# Patient Record
Sex: Male | Born: 1935 | Race: White | Hispanic: No | Smoking: Former smoker
Health system: Southern US, Community
[De-identification: ages and names within clinical notes are randomized; demographics above are authoritative.]

## PROBLEM LIST (undated history)

## (undated) DIAGNOSIS — K635 Polyp of colon: Secondary | ICD-10-CM

## (undated) DIAGNOSIS — I10 Essential (primary) hypertension: Secondary | ICD-10-CM

## (undated) DIAGNOSIS — E785 Hyperlipidemia, unspecified: Secondary | ICD-10-CM

## (undated) DIAGNOSIS — K449 Diaphragmatic hernia without obstruction or gangrene: Secondary | ICD-10-CM

## (undated) DIAGNOSIS — I2510 Atherosclerotic heart disease of native coronary artery without angina pectoris: Secondary | ICD-10-CM

## (undated) DIAGNOSIS — R011 Cardiac murmur, unspecified: Secondary | ICD-10-CM

## (undated) DIAGNOSIS — G309 Alzheimer's disease, unspecified: Secondary | ICD-10-CM

## (undated) DIAGNOSIS — F0280 Dementia in other diseases classified elsewhere without behavioral disturbance: Secondary | ICD-10-CM

## (undated) DIAGNOSIS — R7301 Impaired fasting glucose: Secondary | ICD-10-CM

## (undated) DIAGNOSIS — N289 Disorder of kidney and ureter, unspecified: Secondary | ICD-10-CM

## (undated) DIAGNOSIS — H905 Unspecified sensorineural hearing loss: Secondary | ICD-10-CM

## (undated) DIAGNOSIS — N259 Disorder resulting from impaired renal tubular function, unspecified: Secondary | ICD-10-CM

## (undated) DIAGNOSIS — K219 Gastro-esophageal reflux disease without esophagitis: Secondary | ICD-10-CM

## (undated) DIAGNOSIS — R972 Elevated prostate specific antigen [PSA]: Secondary | ICD-10-CM

## (undated) HISTORY — DX: Atherosclerotic heart disease of native coronary artery without angina pectoris: I25.10

## (undated) HISTORY — DX: Cardiac murmur, unspecified: R01.1

## (undated) HISTORY — DX: Impaired fasting glucose: R73.01

## (undated) HISTORY — DX: Essential (primary) hypertension: I10

## (undated) HISTORY — DX: Diaphragmatic hernia without obstruction or gangrene: K44.9

## (undated) HISTORY — DX: Hyperlipidemia, unspecified: E78.5

## (undated) HISTORY — DX: Gastro-esophageal reflux disease without esophagitis: K21.9

## (undated) HISTORY — DX: Disorder resulting from impaired renal tubular function, unspecified: N25.9

## (undated) HISTORY — DX: Polyp of colon: K63.5

## (undated) HISTORY — DX: Disorder of kidney and ureter, unspecified: N28.9

## (undated) HISTORY — DX: Elevated prostate specific antigen (PSA): R97.20

## (undated) HISTORY — DX: Unspecified sensorineural hearing loss: H90.5

---

## 1989-05-12 HISTORY — PX: OTHER SURGICAL HISTORY: SHX169

## 1992-05-12 HISTORY — PX: OTHER SURGICAL HISTORY: SHX169

## 1997-08-10 ENCOUNTER — Encounter: Payer: Self-pay | Admitting: Family Medicine

## 1997-08-10 HISTORY — PX: OTHER SURGICAL HISTORY: SHX169

## 1998-02-09 HISTORY — PX: COLONOSCOPY: SHX174

## 1999-01-11 ENCOUNTER — Encounter: Payer: Self-pay | Admitting: Family Medicine

## 2000-01-11 ENCOUNTER — Encounter: Payer: Self-pay | Admitting: Family Medicine

## 2000-01-11 LAB — CONVERTED CEMR LAB: PSA: 1.4 ng/mL

## 2001-01-10 ENCOUNTER — Encounter: Payer: Self-pay | Admitting: Family Medicine

## 2001-01-10 LAB — CONVERTED CEMR LAB: PSA: 1.7 ng/mL

## 2002-01-10 ENCOUNTER — Encounter: Payer: Self-pay | Admitting: Family Medicine

## 2002-08-15 HISTORY — PX: ESOPHAGOGASTRODUODENOSCOPY: SHX1529

## 2003-01-11 ENCOUNTER — Encounter: Payer: Self-pay | Admitting: Family Medicine

## 2003-01-11 LAB — CONVERTED CEMR LAB: PSA: 1.4 ng/mL

## 2003-04-21 ENCOUNTER — Other Ambulatory Visit: Payer: Self-pay

## 2003-05-11 HISTORY — PX: HERNIA REPAIR: SHX51

## 2004-03-12 ENCOUNTER — Encounter: Payer: Self-pay | Admitting: Family Medicine

## 2004-03-12 LAB — CONVERTED CEMR LAB: PSA: 2.07 ng/mL

## 2004-03-21 ENCOUNTER — Ambulatory Visit: Payer: Self-pay | Admitting: Family Medicine

## 2004-03-25 ENCOUNTER — Ambulatory Visit: Payer: Self-pay | Admitting: Family Medicine

## 2004-04-24 ENCOUNTER — Ambulatory Visit: Payer: Self-pay | Admitting: Family Medicine

## 2004-08-16 ENCOUNTER — Ambulatory Visit: Payer: Self-pay | Admitting: Family Medicine

## 2004-09-26 ENCOUNTER — Ambulatory Visit: Payer: Self-pay | Admitting: Family Medicine

## 2004-10-11 ENCOUNTER — Ambulatory Visit: Payer: Self-pay | Admitting: Family Medicine

## 2004-10-30 ENCOUNTER — Ambulatory Visit: Payer: Self-pay | Admitting: Family Medicine

## 2004-11-04 ENCOUNTER — Ambulatory Visit: Payer: Self-pay | Admitting: Family Medicine

## 2005-01-03 ENCOUNTER — Ambulatory Visit: Payer: Self-pay | Admitting: Family Medicine

## 2005-01-20 ENCOUNTER — Ambulatory Visit: Payer: Self-pay | Admitting: Family Medicine

## 2005-01-22 ENCOUNTER — Ambulatory Visit: Payer: Self-pay | Admitting: Family Medicine

## 2005-03-12 ENCOUNTER — Encounter: Payer: Self-pay | Admitting: Family Medicine

## 2005-03-27 ENCOUNTER — Ambulatory Visit: Payer: Self-pay | Admitting: Family Medicine

## 2005-03-31 ENCOUNTER — Ambulatory Visit: Payer: Self-pay | Admitting: Family Medicine

## 2005-04-24 ENCOUNTER — Ambulatory Visit: Payer: Self-pay | Admitting: Family Medicine

## 2005-04-28 ENCOUNTER — Ambulatory Visit: Payer: Self-pay | Admitting: Family Medicine

## 2005-04-30 ENCOUNTER — Ambulatory Visit: Payer: Self-pay | Admitting: Family Medicine

## 2005-06-09 ENCOUNTER — Ambulatory Visit: Payer: Self-pay | Admitting: Family Medicine

## 2005-06-11 ENCOUNTER — Ambulatory Visit: Payer: Self-pay | Admitting: Family Medicine

## 2005-09-09 HISTORY — PX: CYSTOSCOPY: SUR368

## 2005-10-08 ENCOUNTER — Ambulatory Visit: Payer: Self-pay | Admitting: Family Medicine

## 2005-10-10 ENCOUNTER — Ambulatory Visit: Payer: Self-pay | Admitting: Family Medicine

## 2006-03-12 ENCOUNTER — Encounter: Payer: Self-pay | Admitting: Family Medicine

## 2006-03-30 ENCOUNTER — Ambulatory Visit: Payer: Self-pay | Admitting: Family Medicine

## 2006-04-01 ENCOUNTER — Ambulatory Visit: Payer: Self-pay | Admitting: Family Medicine

## 2006-04-24 ENCOUNTER — Ambulatory Visit: Payer: Self-pay | Admitting: Family Medicine

## 2006-09-28 ENCOUNTER — Ambulatory Visit: Payer: Self-pay | Admitting: Family Medicine

## 2006-09-28 LAB — CONVERTED CEMR LAB
BUN: 33 mg/dL — ABNORMAL HIGH (ref 6–23)
Glucose, Bld: 101 mg/dL — ABNORMAL HIGH (ref 70–99)
Microalb Creat Ratio: 244.6 mg/g — ABNORMAL HIGH (ref 0.0–30.0)
PSA: 2.47 ng/mL (ref 0.10–4.00)

## 2006-09-29 ENCOUNTER — Encounter: Payer: Self-pay | Admitting: Family Medicine

## 2006-09-29 DIAGNOSIS — I10 Essential (primary) hypertension: Secondary | ICD-10-CM | POA: Insufficient documentation

## 2006-09-29 DIAGNOSIS — H905 Unspecified sensorineural hearing loss: Secondary | ICD-10-CM

## 2006-09-29 DIAGNOSIS — R7301 Impaired fasting glucose: Secondary | ICD-10-CM

## 2006-09-29 DIAGNOSIS — K449 Diaphragmatic hernia without obstruction or gangrene: Secondary | ICD-10-CM | POA: Insufficient documentation

## 2006-09-29 DIAGNOSIS — E785 Hyperlipidemia, unspecified: Secondary | ICD-10-CM | POA: Insufficient documentation

## 2006-09-29 DIAGNOSIS — R972 Elevated prostate specific antigen [PSA]: Secondary | ICD-10-CM | POA: Insufficient documentation

## 2006-09-29 DIAGNOSIS — K219 Gastro-esophageal reflux disease without esophagitis: Secondary | ICD-10-CM

## 2006-09-29 DIAGNOSIS — N183 Chronic kidney disease, stage 3 (moderate): Secondary | ICD-10-CM

## 2006-09-29 DIAGNOSIS — D126 Benign neoplasm of colon, unspecified: Secondary | ICD-10-CM | POA: Insufficient documentation

## 2006-09-29 DIAGNOSIS — I2510 Atherosclerotic heart disease of native coronary artery without angina pectoris: Secondary | ICD-10-CM | POA: Insufficient documentation

## 2006-10-01 ENCOUNTER — Ambulatory Visit: Payer: Self-pay | Admitting: Family Medicine

## 2007-04-07 ENCOUNTER — Encounter (INDEPENDENT_AMBULATORY_CARE_PROVIDER_SITE_OTHER): Payer: Self-pay | Admitting: *Deleted

## 2007-04-28 ENCOUNTER — Ambulatory Visit: Payer: Self-pay | Admitting: Unknown Physician Specialty

## 2007-06-21 ENCOUNTER — Encounter: Payer: Self-pay | Admitting: Family Medicine

## 2007-09-27 ENCOUNTER — Encounter: Payer: Self-pay | Admitting: Family Medicine

## 2007-11-04 ENCOUNTER — Ambulatory Visit: Payer: Self-pay | Admitting: Cardiology

## 2008-03-30 ENCOUNTER — Encounter: Payer: Self-pay | Admitting: Family Medicine

## 2008-04-18 ENCOUNTER — Ambulatory Visit: Payer: Self-pay | Admitting: Internal Medicine

## 2008-08-03 ENCOUNTER — Ambulatory Visit: Payer: Self-pay | Admitting: Unknown Physician Specialty

## 2008-09-25 ENCOUNTER — Encounter: Payer: Self-pay | Admitting: Family Medicine

## 2008-10-19 DIAGNOSIS — R011 Cardiac murmur, unspecified: Secondary | ICD-10-CM | POA: Insufficient documentation

## 2009-02-12 ENCOUNTER — Encounter: Payer: Self-pay | Admitting: Family Medicine

## 2009-08-13 ENCOUNTER — Encounter: Payer: Self-pay | Admitting: Family Medicine

## 2009-12-12 ENCOUNTER — Encounter (INDEPENDENT_AMBULATORY_CARE_PROVIDER_SITE_OTHER): Payer: Self-pay | Admitting: *Deleted

## 2010-02-11 ENCOUNTER — Encounter: Payer: Self-pay | Admitting: Family Medicine

## 2010-06-12 NOTE — Letter (Signed)
Summary: Dr. [facility],Note  Dr. Anne Shutter [facility],Note   Imported By: Beau Fanny 08/17/2009 13:30:52  _____________________________________________________________________  External Attachment:    Type:   Image     Comment:   External Document

## 2010-06-12 NOTE — Letter (Signed)
Summary: Derek [name] letter  [place] [facility]  [address] [place], [place] [address]   Phone: [phone]  Fax: [phone]       12/12/2009 MRN: [id number]  Derek [name] [address] [place], [place]  [address]  Dear Mr. Derek [name] and [facility] announce the retirement of Derek [name], M.D., from full-time practice at the [facility] office effective November 08, 2009 and his plans of returning part-time.  It is important to Dr. Hetty [name] and to our practice that you understand that [facility] has seven physicians in our office for your health care needs.  We will continue to offer the same exceptional care that you have today.    Dr. Hetty [name] has spoken to many of you about his plans for retirement and returning part-time in the fall.   We will continue to work with you through the transition to schedule appointments for you in the office and meet the high standards that [facility] is committed to.   Again, it is with great pleasure that we share the news that Dr. Hetty [name] will return to [facility] in October of 2011 with a reduced schedule.    If you have any questions, or would like to request an appointment with one of our physicians, please call us at [phone] and press the option for Scheduling an appointment.  We take pleasure in providing you with excellent patient care and look forward to seeing you at your next office visit.  Our [facility] Physicians are:  Tillman Abide, M.D. Laurita Quint, M.D. Roxy Manns, M.D. Kerby Nora, M.D. Hannah Beat, M.D. Ruthe Mannan, M.D. We proudly welcomed Raechel Ache, M.D. and Eustaquio Boyden, M.D. to the practice in July/August 2011.  Sincerely,  [facility]

## 2010-06-12 NOTE — Letter (Signed)
Summary: Renal Return Patient Eval/[facility]  Renal Return Patient Eval/[facility]   Imported By: Sherian Rein 02/21/2010 09:30:59  _____________________________________________________________________  External Attachment:    Type:   Image     Comment:   External Document

## 2010-07-17 ENCOUNTER — Ambulatory Visit: Payer: Self-pay | Admitting: Internal Medicine

## 2010-08-19 ENCOUNTER — Inpatient Hospital Stay: Payer: Self-pay | Admitting: Internal Medicine

## 2010-09-24 NOTE — Assessment & Plan Note (Signed)
[facility]                            [place] OFFICE NOTE   NAME:[name] [name] TAY                        MRN:          [id number]  DATE:11/04/2007                            DOB:          1930/12/26    PRIMARY CARE PHYSICIAN:  Derek Rosenthal, MD   REASON FOR CONSULTATION:  Evaluate the patient with heart murmur and  coronary artery disease.   HISTORY OF PRESENT ILLNESS:  The patient is now a pleasant 77 year old  gentleman.  I saw him some 10 years ago, when I first started with  [facility].  He had seen our group prior to that for chest discomfort and  questionable MI.  There was never a documented MI.  In fact, I was able  to go back to 1994 and find a catheterization that demonstrated he had  nonobstructive disease with an LAD 30% stenosis and a circumflex 50%  stenosis.   He since has done well.  He lost weight per my suggestions.  He  exercises daily.  With his level of activity, which is walking, he gets  heart rate up, and does not have any chest pressure, neck discomfort,  arm discomfort, activity-induced nausea, vomiting, or excessive  diaphoresis.  He has no palpitation, presyncope, or syncope.  He thinks  his breathing is fine and does not have any resting shortness of breath  with no PND or orthopnea.  He did have one episode of chest discomfort  once while walking in the snow some months ago.   He was noted recently to have a heart murmur and came for further  evaluation of this.  I got the echocardiogram from Dr. Theora [name] office,  and this demonstrates normal left ventricular function with mild  calcific aortic stenosis and mild mitral regurgitation.  Otherwise, it  was unremarkable.   PAST MEDICAL HISTORY:  Hypertension, hyperlipidemia, diverticulosis,  nonobstructive coronary artery disease, gastroesophageal reflux, renal  insufficiency (felt to be secondary to hypertension and nonsteroidals  followed at [name]), and benign prostatic  hypertrophy.   PAST SURGICAL HISTORY:  Right and left inguinal hernia repair.   ALLERGIES/INTOLERANCES:  None.   MEDICATIONS:  1. Aspirin 81 mg daily.  2. Avodart 0.5 mg daily.  3. Lasix 40 mg daily.  4. Omeprazole 20 mg daily.  5. Metoprolol 12.5 mg daily.  6. Cozaar 12.5 mg daily.  7. Lipitor 80 mg daily.   SOCIAL HISTORY:  He has been married for 56 years.  He has two children,  five grandchildren, and five great-grandchildren.  He quit smoking in  1982.  He does not drink alcohol.   FAMILY HISTORY:  Noncontributory for early coronary artery disease.  His  mother had 2 pacemakers.   REVIEW OF SYSTEMS:  As stated in the HPI.  Positive for lightheadedness,  which he thinks is related to some low blood pressures, (he brings a  diary and it demonstrates blood pressures systolic typically in the 110s  and diastolics in the 60s).   PHYSICAL EXAMINATION:  GENERAL:  The patient is in no distress.  VITAL SIGNS:  Blood pressure 132/72,  heart rate 70 and regular, and  weight 147 pounds.  HEENT:  Eyes are remarkable.  Pupils are equal, round, and reactive to  light.  Fundi within normal limits.  Oral mucosa unremarkable.  NECK:  No jugular venous distention at 45 degrees.  Carotid upstroke  brisk and symmetrical.  No bruits.  No thyromegaly.  LYMPHATICS:  No cervical, axillary, or inguinal adenopathy.  LUNGS:  Clear to auscultation bilaterally.  BACK:  No costovertebral angle tenderness.  CHEST:  Unremarkable.  HEART:  PMI not displaced or sustained.  S1 and S2 within normal limits.  No S3 and no S4.  A 2/6 apical brief early peaking systolic murmur  slightly radiating at the aortic outflow tract.  No diastolic murmurs.  ABDOMEN:  Flat.  Positive bowel sounds.  Normal in frequency and pitch.  No bruits, rebound, guarding, or midline pulsatile mass.  No  hepatomegaly or splenomegaly.  SKIN:  No rashes, no nodules.  EXTREMITIES:  2+ pulses throughout.  No edema, no cyanosis, or  clubbing.  NEURO:  Alert and oriented to person, place, and time.  Cranial nerves  II-XII grossly intact.  Motor grossly intact.   DIAGNOSTIC IMAGING:  EKG; ectopic atrial rhythm, axis within normal  limits, intervals within normal limits, and no acute ST-wave changes.   ASSESSMENT/PLAN:  1. Heart murmur.  The patient does have heart murmur related to his      aortic sclerosis.  This is not severe and not causing any symptoms      and it can be followed clinically.  I would only suggest followup      echocardiography if the murmur changes considerably in intensity      over time.  2. Coronary artery disease.  The patient had nonobstructive coronary      disease 15 years ago.  He is active and has no symptoms.  He should      participate aggressively in risk reduction.  No further      cardiovascular testing is suggested.  I would have a low threshold      for testing; however, if he has any symptom such as dyspnea or any      decreased exercise tolerance or chest discomfort in the future.  3. Abnormal EKG.  The patient has an ectopic atrial rhythm as      indicated by his abnormal P-wave axis.  This is not symptomatic and      requires no further therapy.  4. Hypotension.  His blood pressure is low, and he is symptomatic with      this.  However, he tells me Dr. Cherly Hensen at [facility] is following this,      and that he is slowly having his meds titrated down.  I will defer      to Dr. Cherly Hensen and Dr. Manson Passey.  5. Renal insufficiency per his primary team.  6. Followup will be back in this clinic as needed.     [name] Rotunda, MD, [facility]  Electronically Signed    JH/MedQ  DD: 11/04/2007  DT: 11/05/2007  Job #: [id number]   cc:   [name] [name], M.D.

## 2010-09-27 NOTE — Letter (Signed)
April 06, 2006    Dr. Olegario Messier  Urology Dept.,  [facility]   RE:  [name], [name]  MRN:  [id number]  /  DOB:  1930/05/19   Dear Dr. Elease Hashimoto,   This is in regard to a patient we share by the name of Derek [name]. This  is a 77 year old white male whom you are following for renal insufficiency.  The patient has recently had a PSA elevation greater than 10% and I wanted  to make you aware of that. His PSAs have been 1.0 in 1998, 1.5 in 1999, 1.4  in 2000, 1.7 in 2001, 1.9 in 2002, 1.4 in 2003, 2.07 in 2004, 1.48 in 2005  and 3.06 this month. His prostate on exam by me today is at most 20 grams  and feels soft, symmetric with raphe intact and no nodularity. He is without  any symptoms whatsoever and urination has been okay with him.   PAST MEDICAL HISTORY:  Significant for hypertension since 1994, coronary  disease  since 1994, elevated cholesterol since 1995, polyps of the colon  found in 1991 on colonoscopy, most recently seen in April of 2004 with  diverticula and biopsy negative polyps at that time. He also has  sensorineural hearing loss since 1999 with hearing aids, renal insufficiency  as above since September 2004, creatinine clearance in the 47 mg per  deciliter range and elevated glucose since May of last year well-controlled  with a hemoglobin A1c of less than 6 and a micro albumin mildly elevated at  154 mg per deciliter.   MEDICATIONS:  1. Cozaar 25 mg b.i.d.  2. Furosemide 20 mg a day.  3. Toprol XL 50 mg twice a day.  4. Simvastatin 80 mg at night.  5. Prilosec 20 mg a day.  6. AcipHex 20 mg a day when he can get it.  7. Enteric coated 81 mg aspirin a day.  8. He also uses Tylenol fairly routinely.   I appreciate your seeing him and I hope this information helps.    Sincerely,      Arta Silence, MD  Electronically Signed    RNS/MedQ  DD: 04/06/2006  DT: 04/06/2006  Job #: [id number]

## 2010-10-28 ENCOUNTER — Encounter: Payer: Self-pay | Admitting: Cardiovascular Disease

## 2011-02-06 ENCOUNTER — Ambulatory Visit: Payer: Self-pay | Admitting: Internal Medicine

## 2011-02-18 ENCOUNTER — Emergency Department: Payer: Self-pay | Admitting: Emergency Medicine

## 2011-10-31 ENCOUNTER — Encounter: Payer: Self-pay | Admitting: Family Medicine

## 2011-12-04 ENCOUNTER — Emergency Department: Payer: Self-pay | Admitting: Emergency Medicine

## 2012-04-26 ENCOUNTER — Encounter: Payer: Self-pay | Admitting: Cardiovascular Disease

## 2012-05-12 ENCOUNTER — Encounter: Payer: Self-pay | Admitting: Cardiovascular Disease

## 2012-05-25 ENCOUNTER — Ambulatory Visit: Payer: Self-pay | Admitting: Internal Medicine

## 2012-05-25 LAB — CBC CANCER CENTER
Basophil: 2 %
Comment - H1-Com1: NORMAL
Eosinophil #: 0 x10 3/mm (ref 0.0–0.7)
Eosinophil %: 0.1 %
Eosinophil: 1 %
HCT: 29.9 % — ABNORMAL LOW (ref 40.0–52.0)
Lymphocyte #: 0.6 x10 3/mm — ABNORMAL LOW (ref 1.0–3.6)
Lymphocyte %: 6.1 %
Lymphocytes: 3 %
MCHC: 32 g/dL (ref 32.0–36.0)
Neutrophil #: 8.9 x10 3/mm — ABNORMAL HIGH (ref 1.4–6.5)
Platelet: 246 x10 3/mm (ref 150–440)
RBC: 3.13 10*6/uL — ABNORMAL LOW (ref 4.40–5.90)
WBC: 10.3 x10 3/mm (ref 3.8–10.6)

## 2012-05-25 LAB — IRON AND TIBC
Iron Bind.Cap.(Total): 271 ug/dL (ref 250–450)
Iron Saturation: 9 %
Unbound Iron-Bind.Cap.: 247 ug/dL

## 2012-05-25 LAB — RETICULOCYTES: Reticulocyte: 1.05 % (ref 0.7–2.5)

## 2012-05-25 LAB — FOLATE: Folic Acid: 18.5 ng/mL (ref 3.1–100.0)

## 2012-06-12 ENCOUNTER — Ambulatory Visit: Payer: Self-pay | Admitting: Internal Medicine

## 2012-06-12 ENCOUNTER — Encounter: Payer: Self-pay | Admitting: Cardiovascular Disease

## 2012-06-19 ENCOUNTER — Inpatient Hospital Stay: Payer: Self-pay | Admitting: Internal Medicine

## 2012-06-19 LAB — CBC
HCT: 17.2 % — ABNORMAL LOW (ref 40.0–52.0)
HGB: 5.6 g/dL — ABNORMAL LOW (ref 13.0–18.0)
MCH: 31.1 pg (ref 26.0–34.0)
MCHC: 32.9 g/dL (ref 32.0–36.0)
MCV: 95 fL (ref 80–100)
Platelet: 163 x10 3/mm 3 (ref 150–440)
RBC: 1.81 x10 6/mm 3 — ABNORMAL LOW (ref 4.40–5.90)
RDW: 13.1 % (ref 11.5–14.5)
WBC: 2.9 x10 3/mm 3 — ABNORMAL LOW (ref 3.8–10.6)

## 2012-06-19 LAB — URINALYSIS, COMPLETE
Bilirubin,UR: NEGATIVE
Glucose,UR: NEGATIVE mg/dL (ref 0–75)
Ketone: NEGATIVE
Leukocyte Esterase: NEGATIVE
Nitrite: NEGATIVE
Protein: 30
Specific Gravity: 1.013 (ref 1.003–1.030)
Squamous Epithelial: NONE SEEN
WBC UR: 1 /HPF (ref 0–5)

## 2012-06-19 LAB — COMPREHENSIVE METABOLIC PANEL
Alkaline Phosphatase: 64 U/L (ref 50–136)
Anion Gap: 11 (ref 7–16)
Bilirubin,Total: 0.2 mg/dL (ref 0.2–1.0)
Chloride: 113 mmol/L — ABNORMAL HIGH (ref 98–107)
Co2: 19 mmol/L — ABNORMAL LOW (ref 21–32)
EGFR (African American): 25 — ABNORMAL LOW
Potassium: 4.4 mmol/L (ref 3.5–5.1)
SGOT(AST): 29 U/L (ref 15–37)
SGPT (ALT): 24 U/L (ref 12–78)
Sodium: 143 mmol/L (ref 136–145)
Total Protein: 5.8 g/dL — ABNORMAL LOW (ref 6.4–8.2)

## 2012-06-19 LAB — TROPONIN I
Troponin-I: <0.02 ng/mL
Troponin-I: <0.02 ng/mL

## 2012-06-20 LAB — COMPREHENSIVE METABOLIC PANEL
Albumin: 2.7 g/dL — ABNORMAL LOW (ref 3.4–5.0)
Anion Gap: 7 (ref 7–16)
Bilirubin,Total: 0.2 mg/dL (ref 0.2–1.0)
Calcium, Total: 7.6 mg/dL — ABNORMAL LOW (ref 8.5–10.1)
Chloride: 117 mmol/L — ABNORMAL HIGH (ref 98–107)
EGFR (Non-African Amer.): 29 — ABNORMAL LOW
SGOT(AST): 23 U/L (ref 15–37)
Sodium: 143 mmol/L (ref 136–145)

## 2012-06-20 LAB — CBC WITH DIFFERENTIAL/PLATELET
Basophil #: 0 10*3/uL (ref 0.0–0.1)
Basophil %: 0.8 %
Eosinophil #: 0 10*3/uL (ref 0.0–0.7)
Eosinophil %: 1.1 %
HCT: 20 % — ABNORMAL LOW (ref 40.0–52.0)
HGB: 6.6 g/dL — ABNORMAL LOW (ref 13.0–18.0)
Lymphocyte #: 0.8 10*3/uL — ABNORMAL LOW (ref 1.0–3.6)
Lymphocyte %: 28.7 %
MCH: 30.4 pg (ref 26.0–34.0)
MCHC: 33.3 g/dL (ref 32.0–36.0)
MCV: 91 fL (ref 80–100)
Monocyte #: 0.4 x10 3/mm (ref 0.2–1.0)
Monocyte %: 14.3 %
Neutrophil #: 1.6 10*3/uL (ref 1.4–6.5)
Neutrophil %: 55.1 %
Platelet: 165 10*3/uL (ref 150–440)
RBC: 2.19 10*6/uL — ABNORMAL LOW (ref 4.40–5.90)
RDW: 16.3 % — ABNORMAL HIGH (ref 11.5–14.5)
WBC: 2.9 10*3/uL — ABNORMAL LOW (ref 3.8–10.6)

## 2012-06-20 LAB — HEMOGLOBIN: HGB: 7.9 g/dL — ABNORMAL LOW (ref 13.0–18.0)

## 2012-06-20 LAB — URINALYSIS, COMPLETE
Blood: NEGATIVE
Ketone: NEGATIVE
Leukocyte Esterase: NEGATIVE
Protein: NEGATIVE
RBC,UR: <1 /HPF (ref 0–5)
Specific Gravity: 1.005 (ref 1.003–1.030)

## 2012-06-20 LAB — MAGNESIUM: Magnesium: 1.8 mg/dL

## 2012-06-21 LAB — CBC WITH DIFFERENTIAL/PLATELET
Basophil #: 0 10*3/uL (ref 0.0–0.1)
Basophil %: 0.8 %
Eosinophil #: 0.2 10*3/uL (ref 0.0–0.7)
Eosinophil %: 3.7 %
HCT: 24 % — ABNORMAL LOW (ref 40.0–52.0)
HGB: 7.9 g/dL — ABNORMAL LOW (ref 13.0–18.0)
Lymphocyte #: 1 10*3/uL (ref 1.0–3.6)
Lymphocyte %: 23.2 %
MCH: 29.2 pg (ref 26.0–34.0)
MCV: 88 fL (ref 80–100)
Monocyte #: 0.5 x10 3/mm (ref 0.2–1.0)
Monocyte %: 13 %
Neutrophil #: 2.5 10*3/uL (ref 1.4–6.5)
Neutrophil %: 59.3 %
RBC: 2.72 10*6/uL — ABNORMAL LOW (ref 4.40–5.90)
RDW: 18.1 % — ABNORMAL HIGH (ref 11.5–14.5)

## 2012-06-21 LAB — BASIC METABOLIC PANEL
Calcium, Total: 8.1 mg/dL — ABNORMAL LOW (ref 8.5–10.1)
Chloride: 116 mmol/L — ABNORMAL HIGH (ref 98–107)
Co2: 22 mmol/L (ref 21–32)
Creatinine: 1.88 mg/dL — ABNORMAL HIGH (ref 0.60–1.30)
EGFR (African American): 39 — ABNORMAL LOW
EGFR (Non-African Amer.): 33 — ABNORMAL LOW
Glucose: 86 mg/dL (ref 65–99)
Osmolality: 292 (ref 275–301)
Potassium: 5.1 mmol/L (ref 3.5–5.1)

## 2012-06-21 LAB — PROTIME-INR: INR: 1.2

## 2012-06-21 LAB — RETICULOCYTES: Reticulocyte: 3.07 % — ABNORMAL HIGH (ref 0.7–2.5)

## 2012-06-21 LAB — OCCULT BLOOD X 1 CARD TO LAB, STOOL: Occult Blood, Feces: POSITIVE

## 2012-06-21 LAB — IRON AND TIBC
Iron Bind.Cap.(Total): 215 ug/dL — ABNORMAL LOW (ref 250–450)
Iron Saturation: 13 %
Iron: 29 ug/dL — ABNORMAL LOW (ref 65–175)

## 2012-06-22 LAB — CBC WITH DIFFERENTIAL/PLATELET
Basophil %: 0.8 %
Eosinophil #: 0.2 10*3/uL (ref 0.0–0.7)
Eosinophil %: 4.6 %
HCT: 25.1 % — ABNORMAL LOW (ref 40.0–52.0)
Lymphocyte #: 1.1 10*3/uL (ref 1.0–3.6)
MCHC: 33.3 g/dL (ref 32.0–36.0)
MCV: 88 fL (ref 80–100)
Monocyte #: 0.6 x10 3/mm (ref 0.2–1.0)
Monocyte %: 12.9 %
Neutrophil #: 2.6 10*3/uL (ref 1.4–6.5)
Neutrophil %: 57.9 %
Platelet: 214 10*3/uL (ref 150–440)

## 2012-06-22 LAB — PROTIME-INR
INR: 1.1
Prothrombin Time: 14.6 secs (ref 11.5–14.7)

## 2012-06-23 LAB — CBC WITH DIFFERENTIAL/PLATELET
Basophil %: 1.2 %
Eosinophil %: 2.2 %
HCT: 26 % — ABNORMAL LOW (ref 40.0–52.0)
HGB: 8.7 g/dL — ABNORMAL LOW (ref 13.0–18.0)
Lymphocyte #: 0.9 10*3/uL — ABNORMAL LOW (ref 1.0–3.6)
Lymphocyte %: 19.4 %
MCHC: 33.5 g/dL (ref 32.0–36.0)
MCV: 89 fL (ref 80–100)
Monocyte #: 0.5 x10 3/mm (ref 0.2–1.0)
Neutrophil %: 66 %
Platelet: 217 10*3/uL (ref 150–440)
WBC: 4.8 10*3/uL (ref 3.8–10.6)

## 2012-06-23 LAB — PROTIME-INR
INR: 1.1
Prothrombin Time: 14.3 secs (ref 11.5–14.7)

## 2012-06-24 LAB — CBC WITH DIFFERENTIAL/PLATELET
Basophil #: 0.1 x10 3/mm 3 (ref 0.0–0.1)
Basophil %: 1.3 %
Eosinophil #: 0.1 x10 3/mm 3 (ref 0.0–0.7)
Eosinophil %: 3.3 %
HCT: 25.2 % — ABNORMAL LOW (ref 40.0–52.0)
HGB: 8.4 g/dL — ABNORMAL LOW (ref 13.0–18.0)
Lymphocyte %: 20 %
Lymphs Abs: 0.9 x10 3/mm 3 — ABNORMAL LOW (ref 1.0–3.6)
MCH: 29.5 pg (ref 26.0–34.0)
MCHC: 33.2 g/dL (ref 32.0–36.0)
MCV: 89 fL (ref 80–100)
Monocyte #: 0.5 x10 3/mm (ref 0.2–1.0)
Monocyte %: 11 %
Neutrophil #: 2.9 x10 3/mm 3 (ref 1.4–6.5)
Neutrophil %: 64.4 %
Platelet: 216 x10 3/mm 3 (ref 150–440)
RBC: 2.84 x10 6/mm 3 — ABNORMAL LOW (ref 4.40–5.90)
RDW: 17.5 % — ABNORMAL HIGH (ref 11.5–14.5)
WBC: 4.5 x10 3/mm 3 (ref 3.8–10.6)

## 2012-06-25 LAB — BASIC METABOLIC PANEL
Anion Gap: 8 (ref 7–16)
BUN: 33 mg/dL — ABNORMAL HIGH (ref 7–18)
Chloride: 113 mmol/L — ABNORMAL HIGH (ref 98–107)
EGFR (African American): 30 — ABNORMAL LOW
EGFR (Non-African Amer.): 26 — ABNORMAL LOW
Glucose: 89 mg/dL (ref 65–99)
Osmolality: 290 (ref 275–301)
Potassium: 4.7 mmol/L (ref 3.5–5.1)

## 2012-06-25 LAB — CBC WITH DIFFERENTIAL/PLATELET
Basophil #: 0.1 10*3/uL (ref 0.0–0.1)
Basophil %: 1.1 %
Eosinophil #: 0.2 10*3/uL (ref 0.0–0.7)
HCT: 25.2 % — ABNORMAL LOW (ref 40.0–52.0)
Lymphocyte %: 18.2 %
MCH: 29.4 pg (ref 26.0–34.0)
MCHC: 33 g/dL (ref 32.0–36.0)
Monocyte #: 0.6 x10 3/mm (ref 0.2–1.0)
Monocyte %: 12.1 %
Platelet: 215 10*3/uL (ref 150–440)
WBC: 4.7 10*3/uL (ref 3.8–10.6)

## 2012-06-29 ENCOUNTER — Ambulatory Visit: Payer: Self-pay | Admitting: Internal Medicine

## 2012-06-29 LAB — CBC CANCER CENTER
Basophil #: 0.1 x10 3/mm (ref 0.0–0.1)
Basophil %: 1 %
Eosinophil %: 1 %
HCT: 26.9 % — ABNORMAL LOW (ref 40.0–52.0)
HGB: 8.8 g/dL — ABNORMAL LOW (ref 13.0–18.0)
Lymphocyte %: 15 %
MCHC: 32.7 g/dL (ref 32.0–36.0)
MCV: 90 fL (ref 80–100)
Monocyte #: 0.7 x10 3/mm (ref 0.2–1.0)
Monocyte %: 10.4 %
Neutrophil #: 4.7 x10 3/mm (ref 1.4–6.5)
Neutrophil %: 72.6 %
Platelet: 234 x10 3/mm (ref 150–440)

## 2012-07-10 ENCOUNTER — Ambulatory Visit: Payer: Self-pay | Admitting: Internal Medicine

## 2012-07-21 ENCOUNTER — Encounter: Payer: Self-pay | Admitting: Cardiovascular Disease

## 2012-07-27 ENCOUNTER — Ambulatory Visit: Payer: Self-pay | Admitting: Internal Medicine

## 2012-07-27 LAB — CANCER CENTER HEMOGLOBIN: HGB: 9.6 g/dL — ABNORMAL LOW (ref 13.0–18.0)

## 2012-08-10 ENCOUNTER — Encounter: Payer: Self-pay | Admitting: Cardiovascular Disease

## 2012-08-10 ENCOUNTER — Ambulatory Visit: Payer: Self-pay | Admitting: Internal Medicine

## 2012-08-31 LAB — IRON AND TIBC
Iron Saturation: 25 %
Iron: 66 ug/dL (ref 65–175)
Unbound Iron-Bind.Cap.: 196 ug/dL

## 2012-08-31 LAB — FERRITIN: Ferritin (ARMC): 566 ng/mL — ABNORMAL HIGH (ref 8–388)

## 2012-08-31 LAB — RETICULOCYTES
Absolute Retic Count: 0.0323 10*6/uL
Reticulocyte: 0.93 %

## 2012-09-09 ENCOUNTER — Ambulatory Visit: Payer: Self-pay | Admitting: Internal Medicine

## 2012-09-20 LAB — CBC CANCER CENTER
Basophil #: 0.1 x10 3/mm (ref 0.0–0.1)
Basophil %: 1.7 %
Eosinophil #: 0.1 x10 3/mm (ref 0.0–0.7)
HGB: 10.7 g/dL — ABNORMAL LOW (ref 13.0–18.0)
Lymphocyte #: 0.7 x10 3/mm — ABNORMAL LOW (ref 1.0–3.6)
MCHC: 33.2 g/dL (ref 32.0–36.0)
MCV: 92 fL (ref 80–100)
Monocyte #: 0.7 x10 3/mm (ref 0.2–1.0)
Monocyte %: 12.4 %
Neutrophil %: 71.8 %
Platelet: 198 x10 3/mm (ref 150–440)
RBC: 3.52 10*6/uL — ABNORMAL LOW (ref 4.40–5.90)
RDW: 14.6 % — ABNORMAL HIGH (ref 11.5–14.5)
WBC: 5.4 x10 3/mm (ref 3.8–10.6)

## 2012-10-10 ENCOUNTER — Ambulatory Visit: Payer: Self-pay | Admitting: Internal Medicine

## 2013-01-21 ENCOUNTER — Ambulatory Visit: Payer: Self-pay | Admitting: Internal Medicine

## 2013-02-06 IMAGING — CT CT HEAD WITHOUT CONTRAST
1 series · 16 of 30 positions shown, 20 images · non-contrast
Comparison: none

REASON FOR EXAM: CVA
COMMENTS:

PROCEDURE:     KCT - KCT HEAD WITHOUT CONTRAST  - February 06, 2011  [DATE]
RESULT:     Comparison:  07/17/2010
TECHNIQUE: Multiple axial images from the foramen magnum to the vertex were
obtained without IV contrast.

[Series 2: 1 soft tissue · axial · 0.39mm/px · z∈[-107,+28]mm · 16 of 30 slices shown, 20 images]
[im 2/30  brain]
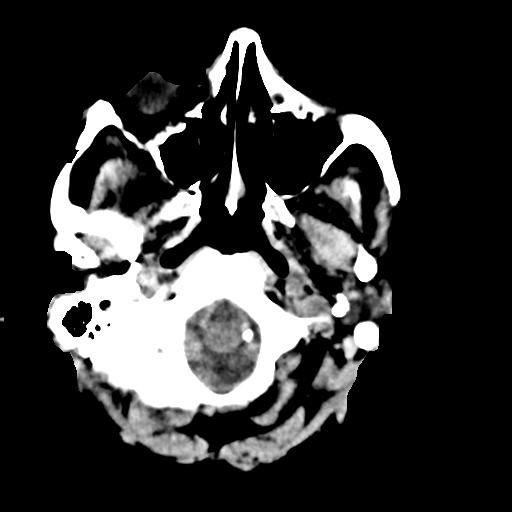
[im 2/30  bone]
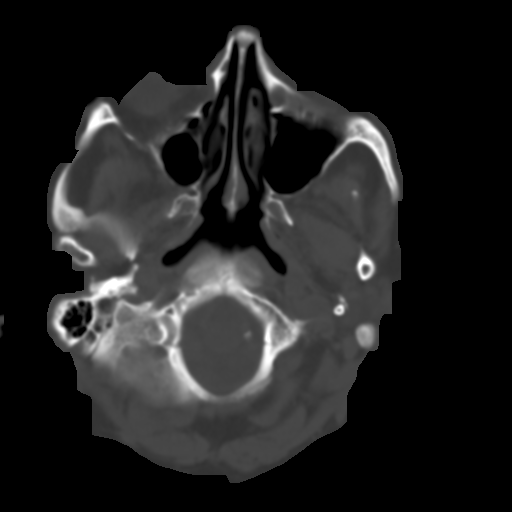
[im 4/30  brain]
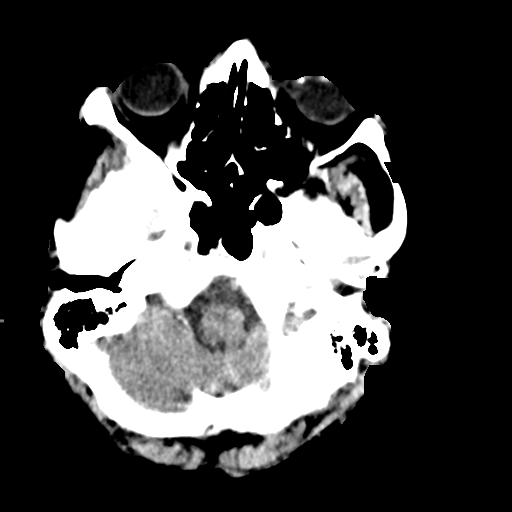
[im 6/30  brain]
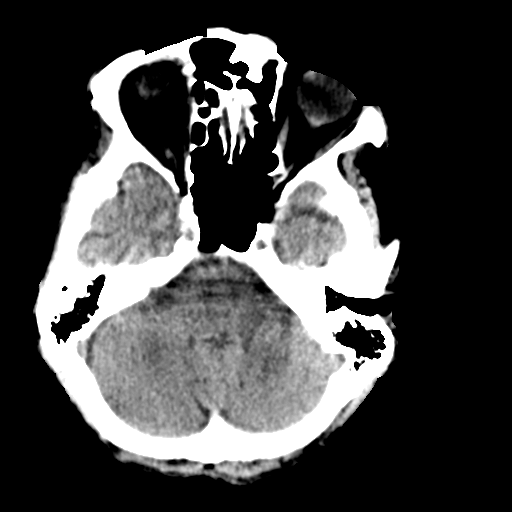
[im 8/30  brain]
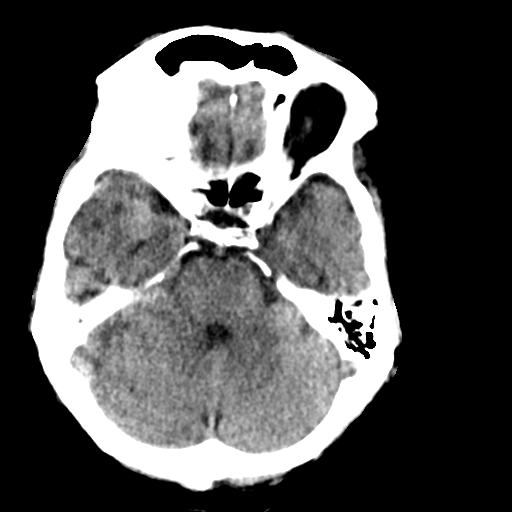
[im 9/30  brain]
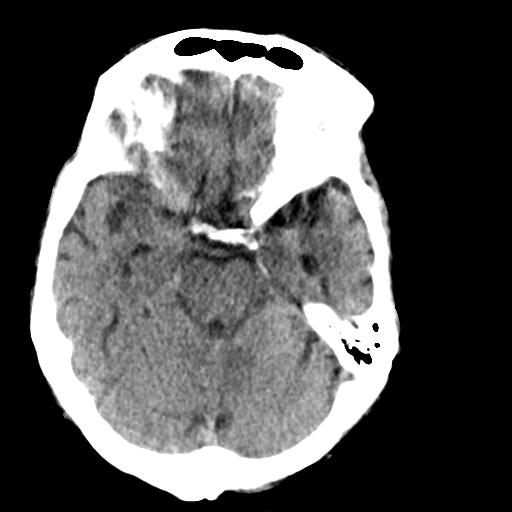
[im 9/30  bone]
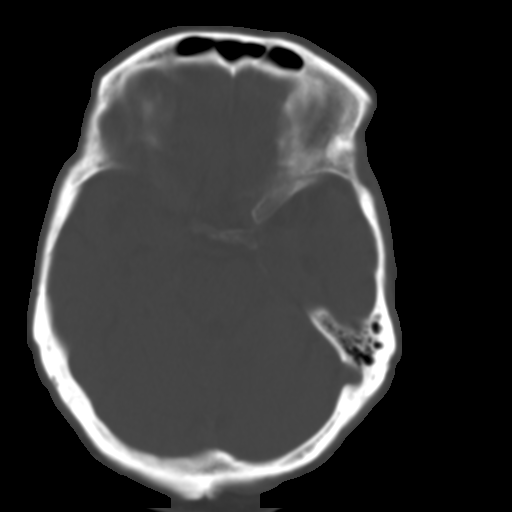
[im 11/30  brain]
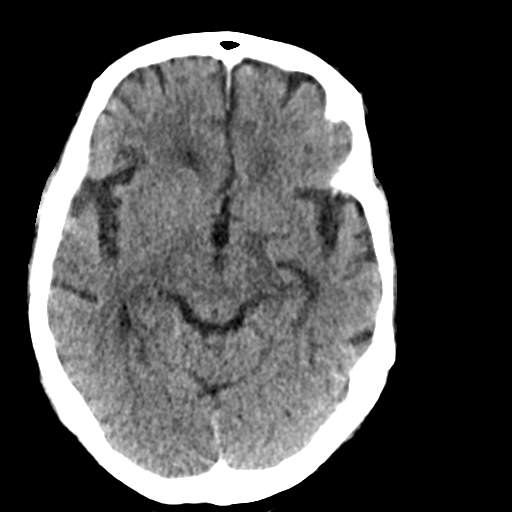
[im 13/30  brain]
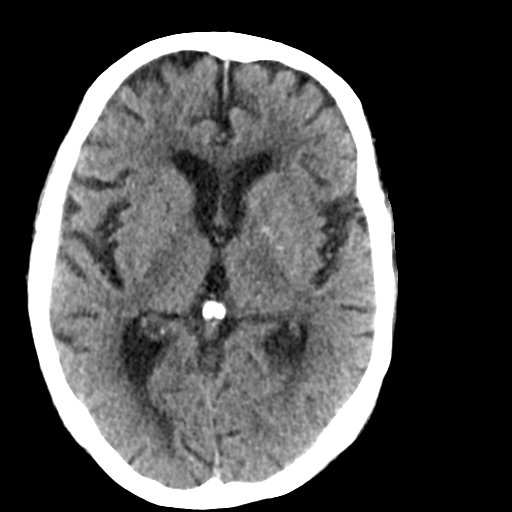
[im 15/30  brain]
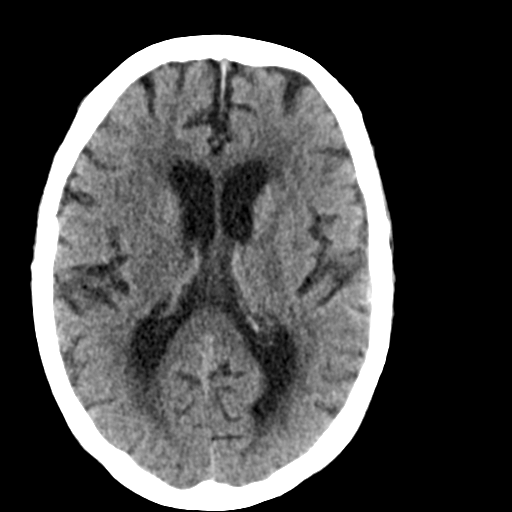
[im 16/30  brain]
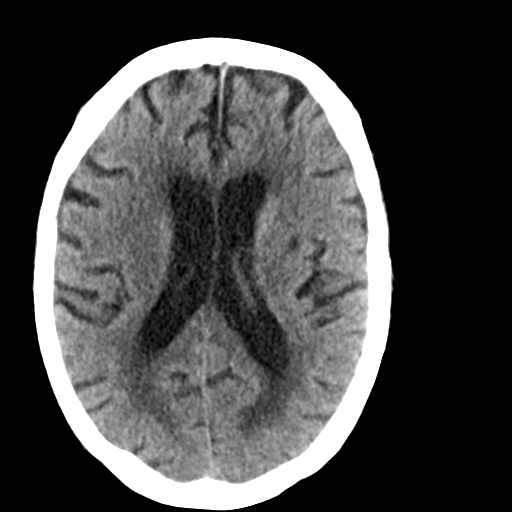
[im 16/30  bone]
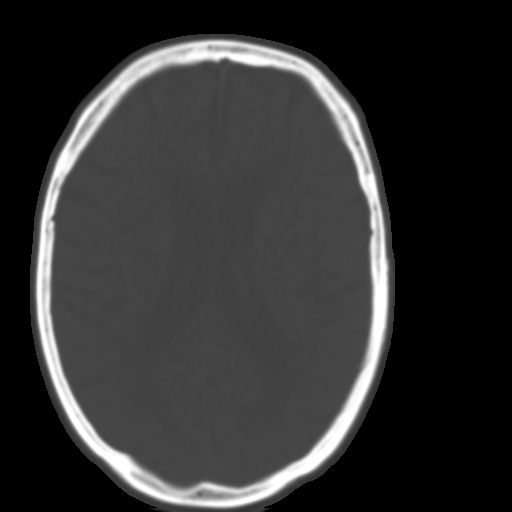
[im 18/30  brain]
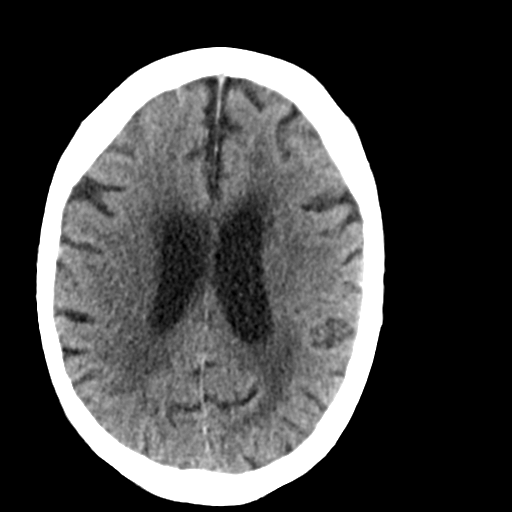
[im 20/30  brain]
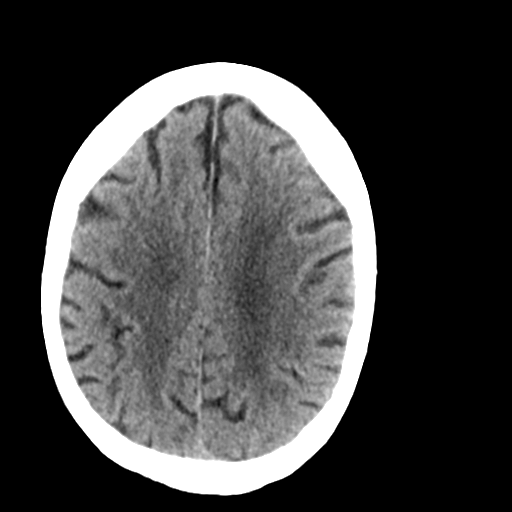
[im 22/30  brain]
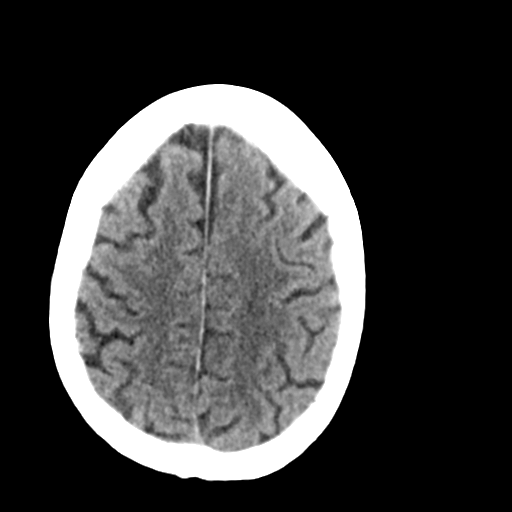
[im 23/30  brain]
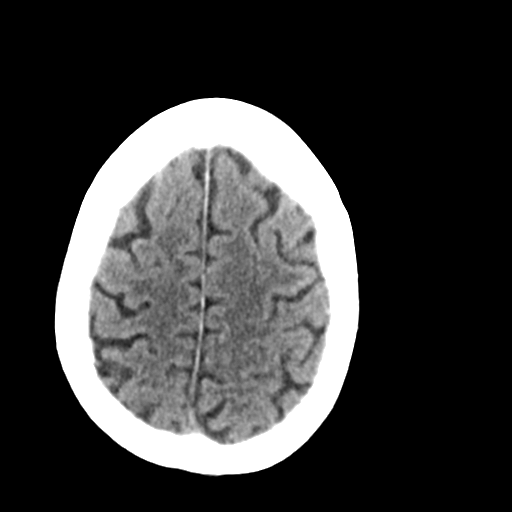
[im 23/30  bone]
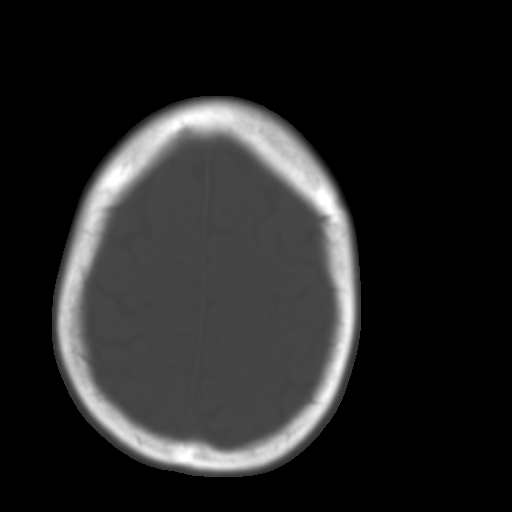
[im 25/30  brain]
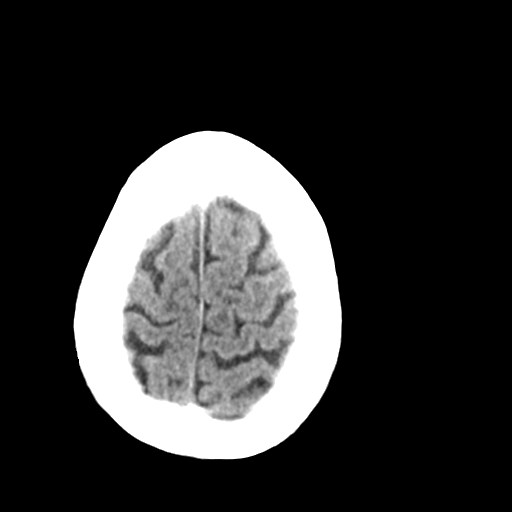
[im 27/30  brain]
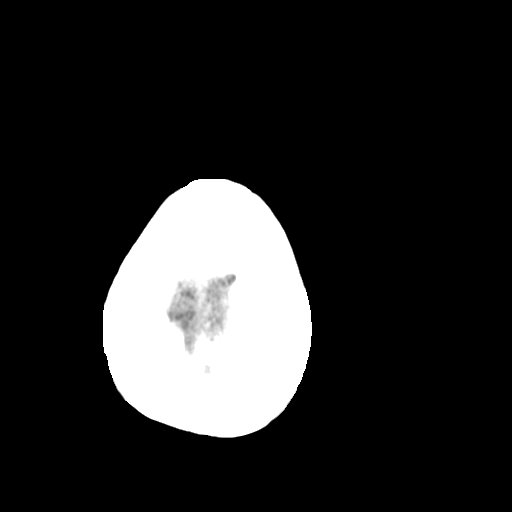
[im 29/30  brain]
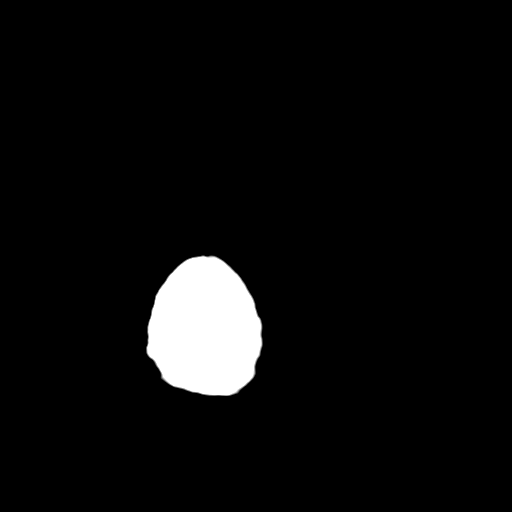

[16 of 30 positions shown; findings below may reference images not displayed]

FINDINGS: There is no evidence for mass effect, midline shift, or extra-axial fluid
collections. There is no evidence for space-occupying lesion, intracranial
hemorrhage, or cortical-based area of infarction. Periventricular and
subcortical hypoattenuation is consistent with chronic small vessel ischemic
disease. There is an old small lacunar infarct in the left external capsule.

The osseous structures are unremarkable.
IMPRESSION: 1. No acute intracranial process.
2. Chronic small vessel ischemic disease.

CT can underestimate ischemia in the first 24 hours after the event. If
there is clinical concern for an acute infarct, a followup MRI or repeat CT
scan in 24 hours may provide additional information.

## 2013-03-31 ENCOUNTER — Emergency Department: Payer: Self-pay | Admitting: Emergency Medicine

## 2013-03-31 LAB — CBC
HCT: 34.7 % — ABNORMAL LOW (ref 40.0–52.0)
MCH: 31.2 pg (ref 26.0–34.0)
MCV: 93 fL (ref 80–100)
Platelet: 232 10*3/uL (ref 150–440)
WBC: 7.4 10*3/uL (ref 3.8–10.6)

## 2013-03-31 LAB — PROTIME-INR
INR: 1.6
Prothrombin Time: 18.7 secs — ABNORMAL HIGH (ref 11.5–14.7)

## 2013-03-31 LAB — BASIC METABOLIC PANEL
BUN: 45 mg/dL — ABNORMAL HIGH (ref 7–18)
EGFR (African American): 25 — ABNORMAL LOW
EGFR (Non-African Amer.): 22 — ABNORMAL LOW
Glucose: 95 mg/dL (ref 65–99)
Osmolality: 283 (ref 275–301)

## 2013-07-27 ENCOUNTER — Emergency Department: Payer: Self-pay | Admitting: Emergency Medicine

## 2013-07-27 LAB — CBC WITH DIFFERENTIAL/PLATELET
BASOS PCT: 0.4 %
Basophil #: 0 10*3/uL (ref 0.0–0.1)
EOS PCT: 0.2 %
Eosinophil #: 0 10*3/uL (ref 0.0–0.7)
HCT: 30.8 % — ABNORMAL LOW (ref 40.0–52.0)
HGB: 10.2 g/dL — ABNORMAL LOW (ref 13.0–18.0)
Lymphocyte #: 0.5 10*3/uL — ABNORMAL LOW (ref 1.0–3.6)
Lymphocyte %: 8.5 %
MCH: 32.1 pg (ref 26.0–34.0)
MCHC: 33.3 g/dL (ref 32.0–36.0)
MCV: 96 fL (ref 80–100)
Monocyte #: 0.8 x10 3/mm (ref 0.2–1.0)
Monocyte %: 13.9 %
NEUTROS PCT: 77 %
Neutrophil #: 4.7 10*3/uL (ref 1.4–6.5)
Platelet: 145 10*3/uL — ABNORMAL LOW (ref 150–440)
RBC: 3.2 10*6/uL — ABNORMAL LOW (ref 4.40–5.90)
RDW: 12.6 % (ref 11.5–14.5)
WBC: 6.1 10*3/uL (ref 3.8–10.6)

## 2013-07-27 LAB — URINALYSIS, COMPLETE
BACTERIA: NONE SEEN
BLOOD: NEGATIVE
Bilirubin,UR: NEGATIVE
GLUCOSE, UR: NEGATIVE mg/dL (ref 0–75)
Ketone: NEGATIVE
LEUKOCYTE ESTERASE: NEGATIVE
Nitrite: NEGATIVE
PH: 5 (ref 4.5–8.0)
Protein: >=500
RBC,UR: NONE SEEN /HPF (ref 0–5)
Specific Gravity: 1.018 (ref 1.003–1.030)
Squamous Epithelial: <1

## 2013-07-27 LAB — COMPREHENSIVE METABOLIC PANEL
ALBUMIN: 2.9 g/dL — AB (ref 3.4–5.0)
ALK PHOS: 55 U/L
ALT: 16 U/L (ref 12–78)
Anion Gap: 7 (ref 7–16)
BILIRUBIN TOTAL: 0.4 mg/dL (ref 0.2–1.0)
BUN: 51 mg/dL — AB (ref 7–18)
CHLORIDE: 106 mmol/L (ref 98–107)
CREATININE: 2.98 mg/dL — AB (ref 0.60–1.30)
Calcium, Total: 8 mg/dL — ABNORMAL LOW (ref 8.5–10.1)
Co2: 26 mmol/L (ref 21–32)
EGFR (African American): 22 — ABNORMAL LOW
EGFR (Non-African Amer.): 19 — ABNORMAL LOW
Glucose: 103 mg/dL — ABNORMAL HIGH (ref 65–99)
OSMOLALITY: 291 (ref 275–301)
POTASSIUM: 4.2 mmol/L (ref 3.5–5.1)
SGOT(AST): 18 U/L (ref 15–37)
Sodium: 139 mmol/L (ref 136–145)
Total Protein: 6.3 g/dL — ABNORMAL LOW (ref 6.4–8.2)

## 2013-07-27 LAB — TROPONIN I: Troponin-I: <0.02 ng/mL

## 2013-07-27 LAB — PROTIME-INR
INR: 0.9
Prothrombin Time: 12.3 secs (ref 11.5–14.7)

## 2013-09-23 ENCOUNTER — Ambulatory Visit: Payer: Self-pay | Admitting: Internal Medicine

## 2014-09-01 NOTE — Consult Note (Signed)
Informed by nurse that daughter requested patient be transferred to [facility]. Transfer is being planned.Was planning to place patient on clear liquid diet for bowel prep this afternoon and then colon/EGD. Therefore, will not change diet and will cancel any procedures. Will sign off. Thanks.  Electronic Signatures: Verdie Shire (MD)  (Signed on 11-Feb-14 09:08)  Authored  Last Updated: 11-Feb-14 09:08 by Verdie Shire (MD)

## 2014-09-01 NOTE — Consult Note (Signed)
Chief Complaint:  Subjective/Chief Complaint Feels better. INR normal with Vit K. No active bleeding but stool heme positive.   VITAL SIGNS/ANCILLARY NOTES: **Vital Signs.:   10-Feb-14 11:18  Vital Signs Type Routine  Temperature Temperature (F) 98.3  Celsius 36.8  Temperature Source oral  Pulse Pulse 63  Respirations Respirations 18  Systolic BP Systolic BP 329  Diastolic BP (mmHg) Diastolic BP (mmHg) 70  Mean BP 101  Pulse Ox % Pulse Ox % 97  Pulse Ox Activity Level  At rest  Oxygen Delivery Room Air/ 21 %   Brief Assessment:  Cardiac Regular   Respiratory clear BS   Gastrointestinal Normal   Lab Results: Routine Sero:  10-Feb-14 09:30   Occult Blood, Feces POSITIVE (Result(s) reported on 21 Jun 2012 at 10:24AM.)   Assessment/Plan:  Assessment/Plan:  Assessment Anemia. Heme + stool.   Plan Will discuss with daughter about scheduling EGD/colon on Wed. IF yes, will need to switch to clears tomorrow and bowel prep tomorrow afternoon. Thanks.   Electronic Signatures: Verdie Shire (MD)  (Signed 10-Feb-14 14:18)  Authored: Chief Complaint, VITAL SIGNS/ANCILLARY NOTES, Brief Assessment, Lab Results, Assessment/Plan   Last Updated: 10-Feb-14 14:18 by Verdie Shire (MD)

## 2014-09-01 NOTE — Consult Note (Signed)
No further bleeding. Hgb stable. EGD completely normal. Colonoscopy showed diffuse diverticuli. No active bleeding. High fiber diet ordered. Can resume anticoagulation if needed. Will sign off. Thanks.  Electronic Signatures: Verdie Shire (MD)  (Signed on 12-Feb-14 12:13)  Authored  Last Updated: 12-Feb-14 12:13 by Verdie Shire (MD)

## 2014-09-01 NOTE — Consult Note (Signed)
HEMATOLOGY followup note -overall feels better, still fatigued on exertion.  Denies dyspnea at rest or orthopnea.Denies obvious bleeding symptoms.  No feversitting in bed, alert and oriented,  no acute distress.  Pallor present           vitals - 98, 70, 16, 139/66, 95% on room air           lungs - bilateral good air entry           abdomen - soft, nontenderhemoglobin 8.3, WBC 4700, platelets 215, creatinine 57.31  81 year old gentleman with known history of anemia secondary to underlying iron deficiency, started on ferrous sulfate 3 times daily in January, along with anemia of chronic renal insufficiency, who has been admitted after weakness and fall and found to have acute drop in hemoglobin to 5.6 grams on February 8th; prior to this it was 9.6 grams on January 14th. Given the acute drop, the most likely etiology is blood loss anemia.  Patient had EGD and colonoscopy which does not show any obvious bleeding source.  Overall he feels better, and is being planned for discharge, hemoglobin is still low but better at 8.3.  Will need close followup, will see him back at [facility] next week and likely pursue parenteral iron therapy to try and improve anemia quicker.  Otherwise, for anemia of renal insufficiency unsure if we can use of Procrit safely given that he has history of conary artery disease and reportedly had CABG surgery a few months ago. The patient was explained above details and he is agreeable to this plan.     Electronic Signatures: Jonn Shingles (MD)  (Signed on 14-Feb-14 15:08)  Authored  Last Updated: 14-Feb-14 15:08 by Jonn Shingles (MD)

## 2014-09-01 NOTE — Consult Note (Signed)
Pt seen and examined. Full consult to follow. Pt with anemia of chronic disease. N/V on Wed and diarrhea on Thurs. Drop in hgb to 5.6. Dark stools but on Fe. Chronic coumadin use. Had gastritis and diverticulosis in 2004. Asc colon polyp removed with diffuse tics in 2008. Again, asc colon polyp removed with diffuse tics in 2010. Blood transfusion yest and today. Feels much better. Poss gastroenteritis. INR elevated. Coumadin on hold. Check stool for blood. If positive, consider repeating EGD/colon later once INR < 1.5. Thanks.  Electronic Signatures: Verdie Shire (MD)  (Signed on 09-Feb-14 09:36)  Authored  Last Updated: 09-Feb-14 09:36 by Verdie Shire (MD)

## 2014-09-01 NOTE — Consult Note (Signed)
Brief Consult Note: Diagnosis: ANEMIA, LYMPHOPENIA.   Patient was seen by consultant.   Comments: SEE DICTATED NOTE TO FOLLOW  RCENTLY SEEN IN CLINIC BY DR PANDIT, WITH HX HGB VARIED 7.1 TO 8.7 TO 9.6, HAD W/U WITH NORMAL EPO AND B12, NEG ANA, COOMBS, SIEP, UIEP, , FERRITIN WAZS OVER 500 BUT IRON SAT WAS LOW AT 9%, CONSISTENT WITH IRON DEFICIENCY . THIS CAN BE DUE TO BLOOD LOSS, BUT 2 STOOLS WERE NEG. Marland Kitchen EXAM NO NODES, ABDO BENIGN, NO FLANK HEMATOMA.  IMP LOOKS LIKE COMBINATION OF IRON DEFICIENCY, POSSIBLE GI BLOOD LOSS, CHRONIC ANEMIA.Marland KitchenNO EVIDENCE OF ACTIVE BLEED BUT CONCERN IN PATIENT ON COUMADIN WITH A FALL, AND SIGNIFICANT DROP IN HGB.    PLAN  DISCUSSED WITH DR Caryl Comes.  NON CONTAST CT TO R/O BLEED. OTHERWISE F/U CBC, TRANSFUSE PRN, RECHECK IRON STORES, NO ROLE FOR IV IRON AT THIS TIME. REASONABLE FOR PROCRIT, NO NEED TO GIVE ACUTELY, WOULD NOT HAVE SIGNIFICANT EFFECT FOR A WEEK, CAN WATCH TODAYS AND TOMORROWS STATUS...DR PANDIT WILL F/U TOMORROW. TRANSFUSE PRN AS PER MEDICINE TODAY. RECHECK SERIAL HGB.  Electronic Signatures: Dallas Schimke (MD)  (Signed 09-Feb-14 14:41)  Authored: Brief Consult Note   Last Updated: 09-Feb-14 14:41 by Dallas Schimke (MD)

## 2014-09-01 NOTE — H&P (Signed)
PATIENT NAME:  Derek [name], Derek [name] MR#:  [id number] DATE OF BIRTH:  15-Sep-1930  DATE OF ADMISSION:  06/19/2012  PRIMARY CARE PHYSICIAN: [name], MD  REQUESTING PHYSICIAN: Latina Craver, MD  CHIEF COMPLAINT: Fall and weakness.   HISTORY OF PRESENT ILLNESS: The patient is a 81 year old male with a known history of hypertension, hyperlipidemia, coronary artery disease, who is being admitted for worsening anemia. The patient has been having nausea, vomiting and diarrhea for the last 3 days, has been feeling very weak, has very poor p.o. intake. He was found to be on the bathroom floor this morning. He slipped over in the bathroom. Denies any major injury. Denied any loss of consciousness. Was brought into the Emergency Department. He also reported that his heart was fluttering before he went to the bathroom. He denies any other symptoms at this time. While in the ED, his blood pressure was 88/45, and was found to have hemoglobin of 5.6 and is being admitted for further evaluation and management.   PAST MEDICAL HISTORY:  1. Hypertension.  2. Hyperlipidemia.  3. Diverticulitis.  4. Coronary artery disease.  5. CKD.  6. Normocytic anemia. 7. Aortic stenosis.  8. BPH.  9. Atrial fibrillation, on Coumadin.   PAST SURGICAL HISTORY:  1. Gum surgery.  2. Laparoscopic right inguinal hernia repair.  3. Left femoral hernia repair.   ALLERGIES: PRADAXA.   SOCIAL HISTORY: No smoking. No alcohol. He lives with his wife, who suffers from cancer.   FAMILY HISTORY: Mother had hypertension and heart failure.   MEDICATIONS AT HOME:  1. Tylenol 650 mg p.o. every 4 hours as needed.  2. Aricept 5 mg p.o.  3. Ascorbic acid 500 mg p.o. b.i.d.  4. Aspirin 81 mg p.o. daily.  5. Cartia XT 120 mg p.o. daily.  6. Coumadin 1.5 mg orally 5 times a week and 3 mg 2 times a week.  7. Ferrous fumarate 324 mg p.o. 3 times a day. 8. Finasteride 5 mg p.o. daily. 9. Lasix 20 mg daily as needed.  10. Losartan  once daily.  11. Metoprolol 50 mg p.o. daily.  12. Prilosec 20 mg p.o. daily. 13. Simvastatin 20 mg p.o. at bedtime.  14. Tamsulosin 0.4 mg p.o. daily.   REVIEW OF SYSTEMS:   CONSTITUTIONAL: No fever. Positive for fatigue and weakness.  EYES: No blurred or double vision.  ENT: No tinnitus, ear pain.  RESPIRATORY: No cough, wheezing, hemoptysis.  CARDIOVASCULAR: No chest pain, orthopnea. Positive for edema.  GASTROINTESTINAL: Positive for nausea, vomiting, diarrhea.  GENITOURINARY: No dysuria or hematuria.  ENDOCRINE: No polyuria or nocturia.  HEMATOLOGY: Positive anemia of chronic disease, now worsening.  PSYCHIATRIC: No history of anxiety or depression.  SKIN: No obvious rash, lesion or ulcer.  MUSCULOSKELETAL: Recent fall. No major injury.   PHYSICAL EXAMINATION:  VITAL SIGNS: Temperature 96.9, heart rate 105 per minute, respirations 18 per minute, blood pressure 88/45 mmHg, saturating 98% on room air. GENERAL: The patient is a 81 year old male lying in the bed comfortably, without any acute distress.  EYES: Pupils equal, round and reactive to light and accommodation. No scleral icterus. Extraocular muscles intact. HEENT: Head atraumatic, normocephalic. Oropharynx and nasopharynx clear.  NECK: Supple. No jugular venous distention. No thyroid enlargement or tenderness.  LUNGS: Clear to auscultation bilaterally. No wheezing, rales, rhonchi or crepitation.  CARDIOVASCULAR: Irregularly irregular heart sounds. No murmurs, rubs or gallops.  ABDOMEN: Soft, nontender, nondistended. Bowel sounds present. No organomegaly or mass. EXTREMITIES: He has 1 to 2+  pedal edema in the right lower extremity compared to the left. He has trace pedal edema in the left lower extremity.  PSYCHIATRIC: The patient is oriented to time, place and person x 3.  SKIN: No obvious rash, lesion or ulcer. Very dry skin.   LABORATORY PANEL: Sodium 140, potassium 4.4, chloride 113, CO2 19, BUN 55, creatinine 2.70,  normal liver function tests. Normal first set of cardiac enzymes. CBC showed white count 2.9, hemoglobin 5.6, hematocrit 17.2, platelets 163. PT 32.5. INR 3.2. Negative UA.    IMAGING: CT scan of the head in the ER showed no acute intracranial abnormality.   ELECTROCARDIOGRAM: Showed no acute ST-T changes.   IMPRESSION AND PLAN:  1. Anemia of chronic disease, now worsening, with hemoglobin of 5.6. He is ordered 1 unit of packed red blood cells and will transfuse more as needed. His anemia is well known and has been  chronic normocytic in nature. Will consult oncology, Dr. Ma Hillock, who is following him regularly, and consult GI. His hemoglobin a month ago was 9.6, two weeks ago it was 7, and now it is 5.6, with slowly going down. His Hemoccult was faintly positive in the Emergency Department. 2. Acute on chronic kidney disease, stage III to IV. Will hydrate him aggressively with IV fluids. This is likely due to ongoing nausea, vomiting, diarrhea with prerenal in nature. Will hold nephrotoxic medication and monitor his kidney function. 3. Nausea, vomiting and diarrhea, likely viral in nature. Will provide symptomatic management. Hydrate him aggressively and monitor closely. If needed, will get stool studies. 4. Status post fall, likely mechanical in nature. He slipped in the bathroom. His CT head is negative. Will monitor him closely with neuro checks every 4 hours, considering him being on Coumadin. Will hold his Coumadin for now.  5. Atrial fibrillation, on Coumadin. Will hold Coumadin. His heart rate is under good control. Will resume his blood pressure medication slowly as tolerated.   CODE STATUS: Full code.   TOTAL TIME TAKING CARE OF THIS PATIENT: 55 minutes.    ____________________________ Lucina Mellow. Manuella Ghazi, MD vss:OSi D: 06/19/2012 10:00:57 ET T: 06/19/2012 12:18:51 ET JOB#: [id number]  cc: Aerabella Galasso S. Manuella Ghazi, MD, <Dictator> [name], MD Lucina Mellow [facility] MD ELECTRONICALLY SIGNED 06/21/2012  16:07

## 2014-09-01 NOTE — Discharge Summary (Signed)
PATIENT NAME:  Derek [name], Derek [name] MR#:  [id number] DATE OF BIRTH:  08-08-1930  DATE OF ADMISSION:  06/19/2012  DATE OF DISCHARGE:   06/25/2012  DISCHARGE DIAGNOSES: 1.  Syncope.  2.  Acute gastrointestinal bleed with severe symptomatic anemia.  3.  Acute on chronic renal failure with dehydration.  4.  Hypertension.  5.  Coronary artery disease.  6.  Aortic stenosis.  7.  Chronic atrial fibrillation.   DISCHARGE MEDICATIONS:  Vitamin C 500 mg b.i.d., Aricept 5 mg at bedtime, aspirin 81 mg daily, Cardia XL 120 mg daily, finasteride 5 mg daily, simvastatin 20 mg at bedtime, Flomax 0.4 mg daily, iron 324 mg t.i.d., metoprolol succinate 50 mg daily, Lasix 20 mg daily p.r.n., losartan 100 mg daily, pantoprazole 40 mg b.i.d.   REASON FOR ADMISSION:   A 81 year old male presents with syncope, acute GI bleed and volume depletion with renal failure. Please see H and P for HPI, past medical history and physical exam.   HOSPITAL COURSE:  The patient was admitted, and hemoglobin of 5.3. He was transfused with 2 units of PRBCs. His hemoglobin stabilized at 8.3. He was found to be grossly heme positive, and ultimately underwent colonoscopy and EGD and abdominal CT, all of which were normal. His hemoglobin stabilized. He came off of Coumadin and stayed on a baby aspirin. It was thought that most likely his diverticula were the cause of his bleed. Down the road, can get him back on Coumadin if his hemoglobin were to stabilize. Hematology has done an extensive workup on his blood and feels that his chronic anemia, of hemoglobin 9.5 mg or so, is related to his renal dysfunction. He was eating well, walking well without help. His blood pressure was   stable. He actually was in normal sinus rhythm on the above meds during this time. Overall prognosis is guarded. Follow up with Dr. Sabra Heck in 1 week.    ____________________________ [name], MD mfm:dm D: 06/25/2012 09:24:00 ET T: 06/25/2012 09:48:32  ET JOB#: [id number]  cc: [name], MD, <Dictator> MARK Roselee Culver MD ELECTRONICALLY SIGNED 06/25/2012 13:55

## 2014-09-01 NOTE — Consult Note (Signed)
PATIENT NAME:  Quirarte, Derek [name] MR#:  [id number] DATE OF BIRTH:  1930/07/04  DATE OF CONSULTATION:  06/21/2012  REFERRING PHYSICIAN:  Dr. Manuella Ghazi CONSULTING PHYSICIAN:  Kelse Ploch R. Ma Hillock, MD  REASON FOR CONSULTATION: Patient known to you, now hemoglobin down to 5.6.   HISTORY OF PRESENT ILLNESS: The patient is a 81 year old gentleman with past medical history significant for normocytic anemia secondary to anemia of chronic renal insufficiency and iron deficiency (had detailed work-up in January 2014 which did show significant iron deficiency), and the patient was started on ferrous sulfate 325 mg p.o. t.i.d., chronic kidney disease stage III, creatinine 2.01 in January, hypertension, aortic stenosis, hyperlipidemia, coronary artery disease, status post CABG September 2013, benign prostatic hypertrophy, hernia repair, who was followed up at [facility] on January 28th. Recently, hemoglobin on January 14th was 9.6 grams with normal WBC count of 10,300 and platelets of 246. The patient has been taking ferrous sulfate 325 mg p.o. t.i.d. He has been, however, admitted to the hospital on February 8th with hemoglobin extremely low at 5.6, WBC count was low at 2.9, and platelet count of 163, MCV 95. Stool Hemoccult was negative x2 in January but is reported positive this admission. He has received blood transfusion, and repeat CBC done today shows WBC count of 4200 with ANC of 2500, platelets normal at 193, and hemoglobin better at 7.9. INR is 1.2. The patient clinically states that he is still tired. He is awaiting further evaluation by GI, given Hemoccult positive stool.   PAST MEDICAL/SURGICAL HISTORY: As in history of present illness above.   FAMILY HISTORY: Remarkable for heart disease.   SOCIAL HISTORY: Remote history of smoking, quit 40 years ago. Denies alcohol usage. The patient is retired.   ALLERGIES: No known drug allergies.   HOME MEDICATIONS:  Metoprolol extended release 25 mg, 1 daily, Prilosec  delayed release 20 mg  1 daily, ascorbic acid 500 mg b.i.d., Coumadin 3 mg daily, ferrous fumarate 325 mg t.i.d., Aricept 5 mg daily, aspirin 81 mg daily, Cartia XT extended release 120 mg 1 daily, finasteride 5 mg 1 daily, simvastatin 20 mg at bedtime, tamsulosin 0.4 mg daily, Tylenol p.r.n. for pain.   REVIEW OF SYSTEMS:  CONSTITUTIONAL: Overall weakness and tiredness, slightly better since transfusion. No fever or chills.  HEENT: Denies any headaches or dizziness at rest. No epistaxis, ear or jaw pain.  CARDIAC: Denies any angina, palpitation, orthopnea, or PND.  LUNGS: Has dyspnea on exertion. No new cough, sputum, hemoptysis, or chest pain.  GASTROINTESTINAL: No nausea or vomiting. No diarrhea. Stools have been dark lately, but he has been on oral iron. Denies bright red blood in stools.  GENITOURINARY: No dysuria or hematuria.  SKIN: No new rashes or pruritus.  HEMATOLOGIC: As in history of present illness.  MUSCULOSKELETAL: No new bone pains. Has chronic arthritis.  NEUROLOGICAL: No new focal weakness or seizures.  ENDOCRINE: No polyuria or polydipsia. Appetite is steady.   PHYSICAL EXAMINATION: GENERAL: The patient is elderly, moderately built and nourished, resting in bed and alert and oriented and answers simple questions appropriately. No icterus. Pallor present.  VITAL SIGNS: 98.3, 63, 18, 165/70, 97% on room air.  HEENT: Normocephalic, atraumatic. Extraocular movements are intact. Sclera are anicteric. No other thrush.  NECK: Negative for lymphadenopathy.  CARDIOVASCULAR: S1 and S2, regular rate and rhythm.  LUNGS: Bilateral good air entry, decreased at bases, no rhonchi.  ABDOMEN: Soft, nontender. No hepatosplenomegaly clinically.  EXTREMITIES: Bilateral 1+ edema.  SKIN: No generalized rashes  or major bruising.  NEUROLOGICAL: Cranial nerves seem intact. Moves all extremities spontaneously.  MUSCULOSKELETAL: No obvious joint deformity or swelling.   LABORATORY, DIAGNOSTIC AND  RADIOLOGICAL DATA: As in HPI above. In addition, recent work-up January 2014 had showed that B12, folate, LDH, haptoglobin, direct Coombs test, ANA, stool Hemoccult x 2 were unremarkable. Serum erythropoietin was 11.9.   IMPRESSION AND RECOMMENDATIONS: The patient is a 81 year old gentleman with known history of anemia secondary to underlying iron deficiency, started on ferrous sulfate 3 times daily in January, along with anemia of chronic renal insufficiency, who has been admitted after weakness and fall and found to have acute drop in hemoglobin to 5.6 grams on February 8th; prior to this it was 9.6 grams on January 14th. Given the acute drop, the most likely etiology is blood loss anemia.  In addition, stool Hemoccult is positive (it was negative last month). I agree with ongoing GI evaluation and continued transfusion support with packed red blood cells as indicated by hemoglobin and symptoms. We will wait until evaluation is completed and then follow up and see if he would need any parenteral iron therapy. WBC count was low upon admission but seems to have improved at this time, and ANC is normal.  Platelets are normal, also, and do not see the need for any bone marrow biopsy at the present time. We will continue to follow and make recommendations as indicated. The patient and daughter, Sharee Pimple, were explained above details and they are agreeable to this plan.   Thank you for the referral. Please feel to contact me if any additional questions.  ____________________________ Rhett Bannister Ma Hillock, MD srp:cb D: 06/21/2012 17:30:09 ET T: 06/21/2012 18:17:00 ET JOB#: [id number]  cc: Ashna Dorough R. Ma Hillock, MD, <Dictator> Alveta Heimlich MD ELECTRONICALLY SIGNED 06/21/2012 22:06

## 2014-09-01 NOTE — Consult Note (Signed)
PATIENT NAME:  Derek [name], Derek [name] MR#:  [id number] DATE OF BIRTH:  [birth year]  DATE OF CONSULTATION:  06/20/2012  REFERRING PHYSICIAN:   CONSULTING PHYSICIAN:  Lupita Dawn. Nettye Flegal, MD  REASON FOR REFERRAL: Severe anemia with drop in hemoglobin.   HISTORY OF PRESENT ILLNESS: The patient is a 81 year old white male who was admitted on February 8th due to worsening anemia. He had a bout of nausea and vomiting starting on Wednesday. This was then followed by diarrhea on Thursday. He has been feeling weak with decreased appetite. He was found to be in a bath on the morning of admission. When he was brought in he was hypotensive with blood pressure of 88/45 and hemoglobin of only 5.6. The patient was then given 1 unit of blood. I was asked to see the patient today. The patient is actually finishing up another unit of blood transfusion this morning.   When I reviewed his endoscopic reports, he had diverticulosis and gastritis back in 2004. It had a large part of the ascending colon removed in 2008. By that time he had diffuse diverticular disease. When the colonoscopy was repeated in 2010, there was still another small polyp in the ascending colon that was removed. Again diffuse diverticulosis was seen. With the 2 units of blood transfusion, the patient is feeling much better.   ADDITIONAL PAST MEDICAL HISTORY: Coronary artery disease and chronic kidney disease. He also has aortic stenosis and BPH. He is on chronic Coumadin for atrial fibrillation. His stool has been dark but he says he takes iron.   PAST SURGICAL HISTORY: Includes inguinal hernia repair and femoral hernia repair.   ALLERGIES: PRADAXA.   SOCIAL HISTORY: He does not smoke or drink.   FAMILY HISTORY: Notable for heart failure and hypertension.   HOME MEDICATIONS: Iron, Coumadin, Cartia daily, finasteride, Aricept, vitamin C and Tylenol as well as baby aspirin. He also takes Prilosec, metoprolol, losartan, Zocor and tamsulosin.   REVIEW OF SYSTEMS:  There are no fevers or chills, but he did complain of fatigue and weakness. There is no blurred vision. There are no hearing or visual changes. No coughing or shortness of breath. There is no chest pain or palpitation. GI symptoms have been described already with nausea, vomiting, diarrhea and some dark stools. No abdominal pain. The rest of the review of systems are negative.   PHYSICAL EXAMINATION: GENERAL: The patient was in no acute distress.  VITAL SIGNS: He was afebrile. He was slightly tachycardic. It did not seem irregular. It seemed more regular today on examination. Respirations 18. Blood pressure was a little low but it was stable by the time I saw him.  HEENT: Normocephalic, atraumatic head. Pupils are equally reactive. Throat was clear.  NECK: Supple.  CARDIAC: Tachycardic rhythm. I did not appreciate any murmurs.  LUNGS: Clear bilaterally.  ABDOMEN: Normoactive bowel sounds. Soft and nontender. There was no hepatomegaly. He had active bowel sounds.  EXTREMITIES: 1+ pedal edema, particularly in the right side.  SKIN: Examination is negative.  NEUROLOGIC: Examination is nonfocal.  LABORATORY STUDIES: On February 9th, sodium was 143, potassium 4.6, chloride 117, CO2 19, BUN 38 and creatinine 2.10. Liver enzymes are normal. Troponin levels are normal. Hemoglobin was 5.6 on admission and it was 6.6 today. INR is still elevated at 3.2. Urinalysis is negative.   ASSESSMENT AND PLAN: This is a patient with significant anemia. He may have anemia of chronic disease, but he could also have iron deficiency anemia as well. We will check stool  for any evidence of blood. Coumadin is being held. He probably had a gastroenteritis which is resolving. If the stool is clearly positive, then we could consider repeating the upper endoscopy and colonoscopy because of history of polyps and gastritis once INR is less than 1.5. I will continue to follow the patient. Thank you for the referral.   ____________________________ Lupita Dawn. Candace Cruise, MD pyo:sb D: 06/21/2012 08:46:00 ET    T: 06/21/2012 08:59:26 ET       JOB#: [id number] cc: Lupita Dawn. Candace Cruise, MD, <Dictator> Lupita Dawn Constance Hackenberg MD ELECTRONICALLY SIGNED 06/22/2012 15:20

## 2014-11-03 ENCOUNTER — Emergency Department: Payer: Medicare Other

## 2014-11-03 ENCOUNTER — Encounter: Payer: Self-pay | Admitting: Medical Oncology

## 2014-11-03 ENCOUNTER — Observation Stay
Admission: EM | Admit: 2014-11-03 | Discharge: 2014-11-04 | Disposition: A | Discharge status: Home Health | Payer: Medicare Other | Attending: Infectious Diseases | Admitting: Infectious Diseases

## 2014-11-03 DIAGNOSIS — F0280 Dementia in other diseases classified elsewhere without behavioral disturbance: Secondary | ICD-10-CM | POA: Insufficient documentation

## 2014-11-03 DIAGNOSIS — G309 Alzheimer's disease, unspecified: Secondary | ICD-10-CM | POA: Insufficient documentation

## 2014-11-03 DIAGNOSIS — N189 Chronic kidney disease, unspecified: Secondary | ICD-10-CM | POA: Insufficient documentation

## 2014-11-03 DIAGNOSIS — K219 Gastro-esophageal reflux disease without esophagitis: Secondary | ICD-10-CM | POA: Diagnosis not present

## 2014-11-03 DIAGNOSIS — Z66 Do not resuscitate: Secondary | ICD-10-CM | POA: Diagnosis not present

## 2014-11-03 DIAGNOSIS — Z7901 Long term (current) use of anticoagulants: Secondary | ICD-10-CM | POA: Diagnosis not present

## 2014-11-03 DIAGNOSIS — N400 Enlarged prostate without lower urinary tract symptoms: Secondary | ICD-10-CM | POA: Insufficient documentation

## 2014-11-03 DIAGNOSIS — I2510 Atherosclerotic heart disease of native coronary artery without angina pectoris: Secondary | ICD-10-CM | POA: Insufficient documentation

## 2014-11-03 DIAGNOSIS — R4701 Aphasia: Secondary | ICD-10-CM | POA: Insufficient documentation

## 2014-11-03 DIAGNOSIS — E860 Dehydration: Principal | ICD-10-CM | POA: Diagnosis present

## 2014-11-03 DIAGNOSIS — Z87891 Personal history of nicotine dependence: Secondary | ICD-10-CM | POA: Insufficient documentation

## 2014-11-03 DIAGNOSIS — R4182 Altered mental status, unspecified: Secondary | ICD-10-CM | POA: Diagnosis present

## 2014-11-03 DIAGNOSIS — E785 Hyperlipidemia, unspecified: Secondary | ICD-10-CM | POA: Insufficient documentation

## 2014-11-03 DIAGNOSIS — N179 Acute kidney failure, unspecified: Secondary | ICD-10-CM

## 2014-11-03 DIAGNOSIS — I129 Hypertensive chronic kidney disease with stage 1 through stage 4 chronic kidney disease, or unspecified chronic kidney disease: Secondary | ICD-10-CM | POA: Insufficient documentation

## 2014-11-03 DIAGNOSIS — I350 Nonrheumatic aortic (valve) stenosis: Secondary | ICD-10-CM | POA: Diagnosis not present

## 2014-11-03 DIAGNOSIS — A084 Viral intestinal infection, unspecified: Secondary | ICD-10-CM | POA: Diagnosis not present

## 2014-11-03 DIAGNOSIS — I4891 Unspecified atrial fibrillation: Secondary | ICD-10-CM | POA: Insufficient documentation

## 2014-11-03 HISTORY — DX: Alzheimer's disease, unspecified: G30.9

## 2014-11-03 HISTORY — DX: Dementia in other diseases classified elsewhere, unspecified severity, without behavioral disturbance, psychotic disturbance, mood disturbance, and anxiety: F02.80

## 2014-11-03 LAB — COMPREHENSIVE METABOLIC PANEL
ALK PHOS: 65 U/L (ref 38–126)
ALT: 13 U/L — ABNORMAL LOW (ref 17–63)
AST: 20 U/L (ref 15–41)
Albumin: 3.8 g/dL (ref 3.5–5.0)
Anion gap: 9 (ref 5–15)
BUN: 47 mg/dL — AB (ref 6–20)
CALCIUM: 8.9 mg/dL (ref 8.9–10.3)
CO2: 23 mmol/L (ref 22–32)
Chloride: 109 mmol/L (ref 101–111)
Creatinine, Ser: 3.45 mg/dL — ABNORMAL HIGH (ref 0.61–1.24)
GFR calc Af Amer: 18 mL/min — ABNORMAL LOW (ref >60)
GFR, EST NON AFRICAN AMERICAN: 15 mL/min — AB (ref >60)
Glucose, Bld: 95 mg/dL (ref 65–99)
POTASSIUM: 5 mmol/L (ref 3.5–5.1)
SODIUM: 141 mmol/L (ref 135–145)
TOTAL PROTEIN: 7.4 g/dL (ref 6.5–8.1)
Total Bilirubin: 0.4 mg/dL (ref 0.3–1.2)

## 2014-11-03 LAB — URINALYSIS COMPLETE WITH MICROSCOPIC (ARMC ONLY)
Bacteria, UA: NONE SEEN
Bilirubin Urine: NEGATIVE
Glucose, UA: 50 mg/dL — AB
Hgb urine dipstick: NEGATIVE
KETONES UR: NEGATIVE mg/dL
Leukocytes, UA: NEGATIVE
Nitrite: NEGATIVE
PH: 6 (ref 5.0–8.0)
PROTEIN: 100 mg/dL — AB
SPECIFIC GRAVITY, URINE: 1.011 (ref 1.005–1.030)

## 2014-11-03 LAB — CBC WITH DIFFERENTIAL/PLATELET
BASOS ABS: 0.1 10*3/uL (ref 0–0.1)
BASOS PCT: 1 %
EOS PCT: 0 %
Eosinophils Absolute: 0 10*3/uL (ref 0–0.7)
HCT: 31 % — ABNORMAL LOW (ref 40.0–52.0)
Hemoglobin: 10.2 g/dL — ABNORMAL LOW (ref 13.0–18.0)
Lymphocytes Relative: 9 %
Lymphs Abs: 0.7 10*3/uL — ABNORMAL LOW (ref 1.0–3.6)
MCH: 30.9 pg (ref 26.0–34.0)
MCHC: 32.9 g/dL (ref 32.0–36.0)
MCV: 93.9 fL (ref 80.0–100.0)
Monocytes Absolute: 0.6 10*3/uL (ref 0.2–1.0)
Monocytes Relative: 8 %
Neutro Abs: 6.1 10*3/uL (ref 1.4–6.5)
Neutrophils Relative %: 82 %
PLATELETS: 232 10*3/uL (ref 150–440)
RBC: 3.3 MIL/uL — ABNORMAL LOW (ref 4.40–5.90)
RDW: 13.1 % (ref 11.5–14.5)
WBC: 7.4 10*3/uL (ref 3.8–10.6)

## 2014-11-03 LAB — TROPONIN I

## 2014-11-03 LAB — PROTIME-INR
INR: 2.27
PROTHROMBIN TIME: 25.2 s — AB (ref 11.4–15.0)

## 2014-11-03 MED ORDER — PANTOPRAZOLE SODIUM 40 MG PO TBEC: 40.0000 mg | DELAYED_RELEASE_TABLET | Freq: Two times a day (BID) | ORAL | Status: DC

## 2014-11-03 MED ORDER — ONDANSETRON HCL 4 MG/2ML IJ SOLN: 4.0000 mg | Freq: Four times a day (QID) | INTRAMUSCULAR | Status: DC | PRN

## 2014-11-03 MED ORDER — WARFARIN SODIUM 2 MG PO TABS: 3.0000 mg | ORAL_TABLET | ORAL | Status: DC

## 2014-11-03 MED ORDER — FINASTERIDE 5 MG PO TABS
5.0000 mg | ORAL_TABLET | Freq: Every day | ORAL | Status: DC
  Administered 2014-11-04: 5 mg via ORAL
  Filled 2014-11-03: qty 1

## 2014-11-03 MED ORDER — FINASTERIDE 5 MG PO TABS: 5.0000 mg | ORAL_TABLET | Freq: Every day | ORAL | Status: DC

## 2014-11-03 MED ORDER — DILTIAZEM HCL ER COATED BEADS 120 MG PO CP24
120.0000 mg | ORAL_CAPSULE | Freq: Two times a day (BID) | ORAL | Status: DC
  Filled 2014-11-03 (×3): qty 1

## 2014-11-03 MED ORDER — WARFARIN SODIUM 3 MG PO TABS
1.5000 mg | ORAL_TABLET | ORAL | Status: DC
  Filled 2014-11-03: qty 0.5

## 2014-11-03 MED ORDER — PAROXETINE HCL 20 MG PO TABS: 10.0000 mg | ORAL_TABLET | ORAL | Status: DC

## 2014-11-03 MED ORDER — LOSARTAN POTASSIUM 50 MG PO TABS
100.0000 mg | ORAL_TABLET | Freq: Every day | ORAL | Status: DC
  Administered 2014-11-04: 100 mg via ORAL
  Filled 2014-11-03: qty 2

## 2014-11-03 MED ORDER — WARFARIN SODIUM 2 MG PO TABS
3.0000 mg | ORAL_TABLET | ORAL | Status: DC
  Filled 2014-11-03: qty 1.5

## 2014-11-03 MED ORDER — SODIUM CHLORIDE 0.9 % IV SOLN: 1000.0000 mL | Freq: Once | INTRAVENOUS | Status: DC

## 2014-11-03 MED ORDER — PAROXETINE HCL 20 MG PO TABS
10.0000 mg | ORAL_TABLET | ORAL | Status: DC
  Administered 2014-11-04: 10 mg via ORAL
  Filled 2014-11-03: qty 0.5

## 2014-11-03 MED ORDER — ONDANSETRON HCL 4 MG PO TABS: 4.0000 mg | ORAL_TABLET | Freq: Four times a day (QID) | ORAL | Status: DC | PRN

## 2014-11-03 MED ORDER — HYDRALAZINE HCL 20 MG/ML IJ SOLN
10.0000 mg | Freq: Four times a day (QID) | INTRAMUSCULAR | Status: DC | PRN
  Administered 2014-11-03: 10 mg via INTRAVENOUS

## 2014-11-03 MED ORDER — WARFARIN - PHARMACIST DOSING INPATIENT: Freq: Every day | Status: DC

## 2014-11-03 MED ORDER — HYDRALAZINE HCL 20 MG/ML IJ SOLN
INTRAMUSCULAR | Status: AC
  Administered 2014-11-03: 10 mg via INTRAVENOUS
  Filled 2014-11-03: qty 1

## 2014-11-03 MED ORDER — PANTOPRAZOLE SODIUM 40 MG PO TBEC
40.0000 mg | DELAYED_RELEASE_TABLET | Freq: Two times a day (BID) | ORAL | Status: DC
  Administered 2014-11-03 – 2014-11-04 (×2): 40 mg via ORAL
  Filled 2014-11-03: qty 1

## 2014-11-03 MED ORDER — LOSARTAN POTASSIUM 50 MG PO TABS: 100.0000 mg | ORAL_TABLET | Freq: Every day | ORAL | Status: DC

## 2014-11-03 MED ORDER — DILTIAZEM HCL ER COATED BEADS 120 MG PO CP24
120.0000 mg | ORAL_CAPSULE | Freq: Two times a day (BID) | ORAL | Status: DC
  Administered 2014-11-03 – 2014-11-04 (×2): 120 mg via ORAL
  Filled 2014-11-03: qty 1

## 2014-11-03 NOTE — Care Management (Signed)
Medicare observation letter given and reviewed with daughter Sharee Pimple.  Signed copy sent to medical records

## 2014-11-03 NOTE — ED Provider Notes (Signed)
[facility] Emergency Department Provider Note  ____________________________________________  Time seen: On arrival  I have reviewed the triage vital signs and the nursing notes.   HISTORY  Chief Complaint Emesis   Patient with chronic dementia, history limited   HPI Derek [name] is a 81 y.o. male who presents with altered mental status. Per daughter patient has good days and bad days and today he seemed to have a bad day so she became concerned. He has had mild nausea and she noticed that his stool seemed to be a little black and she became concerned because he is on warfarin. She denies fevers, no cough.     Past Medical History  Diagnosis Date  . Undiagnosed cardiac murmurs   . CAD (coronary artery disease)   . Other and unspecified hyperlipidemia   . Unspecified essential hypertension   . Diaphragmatic hernia without mention of obstruction or gangrene   . Sensory hearing loss, unilateral   . Colon polyps   . Impaired fasting glucose   . Esophageal reflux   . Elevated prostate specific antigen (PSA)   . Unspecified disorder resulting from impaired renal function   . Unspecified disorder of kidney and ureter   . Alzheimer's dementia     Patient Active Problem List   Diagnosis Date Noted  . MURMUR 10/19/2008  . COLONIC POLYPS 09/29/2006  . HYPERLIPIDEMIA 09/29/2006  . LOSS, SENSORINEURAL HEAR, UNILATERAL 09/29/2006  . HYPERTENSION 09/29/2006  . CAD 09/29/2006  . GERD 09/29/2006  . HIATAL HERNIA 09/29/2006  . Chronic kidney disease, stage III (moderate) 09/29/2006  . IMPAIRED FASTING GLUCOSE 09/29/2006  . ELEVATED PROSTATE SPECIFIC ANTIGEN 09/29/2006    Past Surgical History  Procedure Laterality Date  . Polyp removal  1991  . Hop r/o  1994    HTN past mild MI Dr. Gwenlyn Found  . Stress cardiolite  4/99    nml. Dr. Percival Spanish. no change when done 02/06/03  . Colonoscopy  10/99    no polyps. Dr. Vira Agar  . Esophagogastroduodenoscopy   08/15/02    hh erosions of stomah. Dr. Vira Agar  . Hernia repair  05/11/03  . Esophagogastroduodenoscopy  08/15/02    hh erosive gastropathy- duodenal mass   . Cystoscopy  5/07     thickening of bladder wall; other wise wnl    Current Outpatient Rx  Name  Route  Sig  Dispense  Refill  . acetaminophen (TYLENOL) 500 MG tablet   Oral   Take 1,000 mg by mouth every 6 (six) hours as needed for mild pain or headache.         . diltiazem (CARDIZEM CD) 120 MG 24 hr capsule   Oral   Take 120 mg by mouth 2 (two) times daily.         . finasteride (PROSCAR) 5 MG tablet   Oral   Take 5 mg by mouth daily.         . furosemide (LASIX) 20 MG tablet   Oral   Take 20 mg by mouth daily.         Marland Kitchen galantamine (RAZADYNE) 8 MG tablet   Oral   Take 8 mg by mouth 2 (two) times daily with a meal.         . losartan (COZAAR) 100 MG tablet   Oral   Take 100 mg by mouth daily.         . pantoprazole (PROTONIX) 40 MG tablet   Oral   Take 40 mg by  mouth 2 (two) times daily.         Marland Kitchen PARoxetine (PAXIL) 10 MG tablet   Oral   Take 10 mg by mouth every morning.         . warfarin (COUMADIN) 3 MG tablet   Oral   Take 1.5-3 mg by mouth at bedtime. 0.5 tablet on Monday, Wednesday, and Friday; 1 tablet on Tuesday, Thursday, Saturday, and Sunday           Allergies Pradaxa  Family History  Problem Relation Age of Onset  . Cancer      aunt x 2. breast/ovarian/uterine cacner  . Depression Neg Hx   . Drug abuse Neg Hx     no alcohol abuse  . Stroke      grandfather    Social History History  Substance Use Topics  . Smoking status: Former Research scientist (life sciences)  . Smokeless tobacco: Not on file     Comment: quit in 1982  . Alcohol Use: No    Review of Systems  Level V caveat  Unable to obtain review of symptoms secondary to altered mental status  ____________________________________________   PHYSICAL EXAM:  VITAL SIGNS: ED Triage Vitals  Enc Vitals Group     BP 11/03/14  1049 180/76 mmHg     Pulse Rate 11/03/14 1049 61     Resp 11/03/14 1049 20     Temp 11/03/14 1049 97.5 F (36.4 C)     Temp Source 11/03/14 1049 Oral     SpO2 11/03/14 1049 98 %     Weight 11/03/14 1049 130 lb (58.968 kg)     Height 11/03/14 1049 5\' 6"  (1.676 m)     Head Cir --      Peak Flow --      Pain Score --      Pain Loc --      Pain Edu? --      Excl. in [place]? --      Constitutional: Disoriented and confused Eyes: Conjunctivae are normal.  ENT   Head: Normocephalic and atraumatic.   Mouth/Throat: Mucous membranes are moist. Cardiovascular: Normal rate, regular rhythm. Normal and symmetric distal pulses are present in all extremities. No murmurs, rubs, or gallops. Respiratory: Normal respiratory effort without tachypnea nor retractions. Breath sounds are clear and equal bilaterally.  Gastrointestinal: Soft and non-tender in all quadrants. No distention. There is no CVA tenderness. Guaiac negative Genitourinary: deferred Musculoskeletal: Nontender with normal range of motion in all extremities. No lower extremity tenderness nor edema. Neurologic:  Patient is hard of hearing but his speech is normal. No gross focal neurologic deficits are appreciated. Skin:  Skin is warm, dry and intact. No rash noted. Psychiatric: Mood and affect are normal. .  ____________________________________________    LABS (pertinent positives/negatives)  Labs Reviewed  CBC WITH DIFFERENTIAL/PLATELET - Abnormal; Notable for the following:    RBC 3.30 (*)    Hemoglobin 10.2 (*)    HCT 31.0 (*)    Lymphs Abs 0.7 (*)    All other components within normal limits  URINALYSIS COMPLETEWITH MICROSCOPIC ([facility] ONLY) - Abnormal; Notable for the following:    Color, Urine STRAW (*)    APPearance CLEAR (*)    Glucose, UA 50 (*)    Protein, ur 100 (*)    Squamous Epithelial / LPF 0-5 (*)    All other components within normal limits  PROTIME-INR - Abnormal; Notable for the following:     Prothrombin Time 25.2 (*)  All other components within normal limits  TROPONIN I  COMPREHENSIVE METABOLIC PANEL    ____________________________________________   EKG ED ECG REPORT I, Lavonia Drafts, the attending physician, personally viewed and interpreted this ECG.  Date: 11/03/2014 EKG Time: 11 AM Rate: 61 Rhythm: normal sinus rhythm QRS Axis: normal Intervals: normal ST/T Wave abnormalities: normal Conduction Disutrbances: none Narrative Interpretation: unremarkable   ____________________________________________    RADIOLOGY  Chest x-ray reviewed by me, unremarkable CT head no acute distress  ____________________________________________   PROCEDURES  Procedure(s) performed: none  Critical Care performed: none  ____________________________________________   INITIAL IMPRESSION / ASSESSMENT AND PLAN / ED COURSE  Pertinent labs & imaging results that were available during my care of the patient were reviewed by me and considered in my medical decision making (see chart for details).  Patient is chronically ill with dementia and progressive aphasia. Daughter is concerned that he may have a GI bleed or some other cause of worsening altered mental status she also realizes that he could just be having fluctuating mental status.  Patient with acute on chronic renal failure likely secondary to dehydration from nausea and vomiting. No abdominal tenderness to palpation. This patient should be admitted and will consult hospitalists ____________________________________________ ----------------------------------------- 3:13 PM on 11/03/2014 -----------------------------------------  Discussed with Dr. Marthann Schiller, he will see the patient.  FINAL CLINICAL IMPRESSION(S) / ED DIAGNOSES  Final diagnoses:  Acute on chronic renal failure  Altered mental status, unspecified altered mental status type     Lavonia Drafts, MD 11/03/14 1514

## 2014-11-03 NOTE — Progress Notes (Signed)
ANTICOAGULATION CONSULT NOTE - Initial Consult  Pharmacy Consult for Warfarin Indication: atrial fibrillation  Allergies  Allergen Reactions  . Pradaxa [Dabigatran Etexilate Mesylate] Other (See Comments)    Patient Measurements: Height: 5\' 6"  (167.6 cm) Weight: 130 lb (58.968 kg) IBW/kg (Calculated) : 63.8 Heparin Dosing Weight:   Vital Signs: Temp: 98.4 F (36.9 C) (06/24 1833) Temp Source: Oral (06/24 1833) BP: 161/76 mmHg (06/24 1833) Pulse Rate: 78 (06/24 1833)  Labs:  Recent Labs  11/03/14 1116  HGB 10.2*  HCT 31.0*  PLT 232  LABPROT 25.2*  INR 2.27  CREATININE 3.45*  TROPONINI <0.03    Estimated Creatinine Clearance: 14 mL/min (by C-G formula based on Cr of 3.45).   Medical History: Past Medical History  Diagnosis Date  . Undiagnosed cardiac murmurs   . CAD (coronary artery disease)   . Other and unspecified hyperlipidemia   . Unspecified essential hypertension   . Diaphragmatic hernia without mention of obstruction or gangrene   . Sensory hearing loss, unilateral   . Colon polyps   . Impaired fasting glucose   . Esophageal reflux   . Elevated prostate specific antigen (PSA)   . Unspecified disorder resulting from impaired renal function   . Unspecified disorder of kidney and ureter   . Alzheimer's dementia     Medications:  Prescriptions prior to admission  Medication Sig Dispense Refill Last Dose  . acetaminophen (TYLENOL) 500 MG tablet Take 1,000 mg by mouth every 6 (six) hours as needed for mild pain or headache.   10/30/2014  . diltiazem (CARDIZEM CD) 120 MG 24 hr capsule Take 120 mg by mouth 2 (two) times daily.   11/02/2014 at pm  . finasteride (PROSCAR) 5 MG tablet Take 5 mg by mouth daily.   11/02/2014 at am  . furosemide (LASIX) 20 MG tablet Take 20 mg by mouth daily.   11/02/2014 at am  . galantamine (RAZADYNE) 8 MG tablet Take 8 mg by mouth 2 (two) times daily with a meal.   11/02/2014 at pm  . losartan (COZAAR) 100 MG tablet Take 100 mg  by mouth daily.   11/02/2014 at am  . pantoprazole (PROTONIX) 40 MG tablet Take 40 mg by mouth 2 (two) times daily.   11/02/2014 at pm  . PARoxetine (PAXIL) 10 MG tablet Take 10 mg by mouth every morning.   11/02/2014 at am  . warfarin (COUMADIN) 3 MG tablet Take 1.5-3 mg by mouth at bedtime. 0.5 tablet on Monday, Wednesday, and Friday; 1 tablet on Tuesday, Thursday, Saturday, and Sunday   11/02/2014 at pm    Assessment: INR = 2.27   Goal of Therapy:  INR 2-3   Plan: Will continue pt on home warfarin dose:  Warfarin 1.5 mg PO Mon - Wed - Fri.  Warfarin 3 mg PO Tues-Thurs-Sat-Sun.   Rever Pichette D 11/03/2014,6:35 PM

## 2014-11-03 NOTE — Care Management (Signed)
Attempted to deliver Medicare observation letter twice to room.  Patient is confused, and daughter is no longer at bedside.  I have called and left a message on Mrs. Boswell's (daughter) phone.  Awaiting return call.

## 2014-11-03 NOTE — ED Notes (Signed)
Pt with family to triage with reports that pt has had vomiting x 3 days. Last night pt fell in floor per family, did not hit head. Has dementia but has been at base line.

## 2014-11-03 NOTE — H&P (Addendum)
[place] [facility] [place]     MR#:  [id number]  DATE OF BIRTH:  Sep 18, 1930  DATE OF ADMISSION:  11/03/2014  PRIMARY CARE PHYSICIAN: Ria Bush, MD   REQUESTING/REFERRING PHYSICIAN: Dr. Corky Downs  CHIEF COMPLAINT:   Chief Complaint  Patient presents with  . Emesis    HISTORY OF PRESENT ILLNESS: Derek [name]  is a 81 y.o. male with a known history of hypertension hyperlipidemia, diverticulitis, coronary artery disease, chronic kidney disease, normocytic anemia, aortic stenosis, BPH, atrial fibrillation on Coumadin- has gradual worsening of his progressive aphasia and dementia, so for last 2 years he is living with his daughter and she is taking care of him.  The patient is able to walk without any support , needed help in his day to day activities due to his progressive aphasia and some dementia. Last week patient's daughter took him to [facility] and he did not took his galantamine there for a week. He restarted it once came back, but for last 4 days patient is having nausea and vomiting. He is also remaining more drowsy, and appears more weak as per daughter. She tried to feed him and for this little more liquids to prevent him getting from dehydrated. Last night patient also fell down after losing his balance. So today daughter decided to bring him to the emergency room.  In ER his creatinine was noted slightly elevated from 2.98 to 3.45. There was no source of infection identified on initial workup. Because of patient's generalized weakness, dehydration, nausea and vomiting- he is given as admission to hospitalist.   PAST MEDICAL HISTORY:   Past Medical History  Diagnosis Date  . Undiagnosed cardiac murmurs   . CAD (coronary artery disease)   . Other and unspecified hyperlipidemia   . Unspecified essential hypertension   . Diaphragmatic hernia without mention of obstruction or gangrene   . Sensory hearing loss,  unilateral   . Colon polyps   . Impaired fasting glucose   . Esophageal reflux   . Elevated prostate specific antigen (PSA)   . Unspecified disorder resulting from impaired renal function   . Unspecified disorder of kidney and ureter   . Alzheimer's dementia     PAST SURGICAL HISTORY:  Past Surgical History  Procedure Laterality Date  . Polyp removal  1991  . Hop r/o  1994    HTN past mild MI Dr. Gwenlyn Found  . Stress cardiolite  4/99    nml. Dr. Percival Spanish. no change when done 02/06/03  . Colonoscopy  10/99    no polyps. Dr. Vira Agar  . Esophagogastroduodenoscopy  08/15/02    hh erosions of stomah. Dr. Vira Agar  . Hernia repair  05/11/03  . Esophagogastroduodenoscopy  08/15/02    hh erosive gastropathy- duodenal mass   . Cystoscopy  5/07     thickening of bladder wall; other wise wnl    SOCIAL HISTORY:  History  Substance Use Topics  . Smoking status: Former Research scientist (life sciences)  . Smokeless tobacco: Not on file     Comment: quit in 1982  . Alcohol Use: No    FAMILY HISTORY:  Family History  Problem Relation Age of Onset  . Cancer      aunt x 2. breast/ovarian/uterine cacner  . Depression Neg Hx   . Drug abuse Neg Hx     no alcohol abuse  . Stroke      grandfather    DRUG ALLERGIES:  Allergies  Allergen  Reactions  . Pradaxa [Dabigatran Etexilate Mesylate] Other (See Comments)    REVIEW OF SYSTEMS:   CONSTITUTIONAL: No fever, positive for fatigue or weakness.  EYES: No blurred or double vision.  EARS, NOSE, AND THROAT: No tinnitus or ear pain.  RESPIRATORY: No cough, shortness of breath, wheezing or hemoptysis.  CARDIOVASCULAR: No chest pain, orthopnea, edema.  GASTROINTESTINAL: positive for nausea & vomiting, No diarrhea or abdominal pain.  GENITOURINARY: No dysuria, hematuria.  ENDOCRINE: No polyuria, nocturia,  HEMATOLOGY: No anemia, easy bruising or bleeding SKIN: No rash or lesion. MUSCULOSKELETAL: No joint pain or arthritis.   NEUROLOGIC: No tingling, numbness, weakness.   PSYCHIATRY: No anxiety or depression.   MEDICATIONS AT HOME:  Prior to Admission medications   Medication Sig Start Date End Date Taking? Authorizing Provider  acetaminophen (TYLENOL) 500 MG tablet Take 1,000 mg by mouth every 6 (six) hours as needed for mild pain or headache.   Yes Historical Provider, MD  diltiazem (CARDIZEM CD) 120 MG 24 hr capsule Take 120 mg by mouth 2 (two) times daily.   Yes Historical Provider, MD  finasteride (PROSCAR) 5 MG tablet Take 5 mg by mouth daily.   Yes Historical Provider, MD  furosemide (LASIX) 20 MG tablet Take 20 mg by mouth daily.   Yes Historical Provider, MD  galantamine (RAZADYNE) 8 MG tablet Take 8 mg by mouth 2 (two) times daily with a meal.   Yes Historical Provider, MD  losartan (COZAAR) 100 MG tablet Take 100 mg by mouth daily.   Yes Historical Provider, MD  pantoprazole (PROTONIX) 40 MG tablet Take 40 mg by mouth 2 (two) times daily.   Yes Historical Provider, MD  PARoxetine (PAXIL) 10 MG tablet Take 10 mg by mouth every morning.   Yes Historical Provider, MD  warfarin (COUMADIN) 3 MG tablet Take 1.5-3 mg by mouth at bedtime. 0.5 tablet on Monday, Wednesday, and Friday; 1 tablet on Tuesday, Thursday, Saturday, and Sunday   Yes Historical Provider, MD      PHYSICAL EXAMINATION:   VITAL SIGNS: Blood pressure 187/85, pulse 68, temperature 97.5 F (36.4 C), temperature source Oral, resp. rate 13, height 5\' 6"  (1.676 m), weight 58.968 kg (130 lb), SpO2 93 %.  GENERAL:  81 y.o.-year-old patient lying in the bed with no acute distress.  EYES: Pupils equal, round, reactive to light and accommodation. No scleral icterus. Extraocular muscles intact.  HEENT: Head atraumatic, normocephalic. Oropharynx and nasopharynx clear. Hearing aid in use.  NECK:  Supple, no jugular venous distention. No thyroid enlargement, no tenderness.  LUNGS: Normal breath sounds bilaterally, no wheezing, rales,rhonchi or crepitation. No use of accessory muscles of  respiration.  CARDIOVASCULAR: S1, S2 normal. positive murmurs, no gallops.  ABDOMEN: Soft, nontender, nondistended. Bowel sounds present. No organomegaly or mass.  EXTREMITIES: No pedal edema, cyanosis, or clubbing.  NEUROLOGIC: Cranial nerves II through XII are intact. Muscle strength 4/5 in all extremities- generalized weakness. Sensation intact. Gait not checked.  PSYCHIATRIC: The patient is alert , hearing deficit, have fluctuating orientation. SKIN: No obvious rash, lesion, or ulcer.   LABORATORY PANEL:   CBC  Recent Labs Lab 11/03/14 1116  WBC 7.4  HGB 10.2*  HCT 31.0*  PLT 232  MCV 93.9  MCH 30.9  MCHC 32.9  RDW 13.1  LYMPHSABS 0.7*  MONOABS 0.6  EOSABS 0.0  BASOSABS 0.1   ------------------------------------------------------------------------------------------------------------------  Chemistries   Recent Labs Lab 11/03/14 1116  NA 141  K 5.0  CL 109  CO2 23  GLUCOSE  95  BUN 47*  CREATININE 3.45*  CALCIUM 8.9  AST 20  ALT 13*  ALKPHOS 65  BILITOT 0.4   ------------------------------------------------------------------------------------------------------------------ estimated creatinine clearance is 14 mL/min (by C-G formula based on Cr of 3.45).   Coagulation profile  Recent Labs Lab 11/03/14 1116  INR 2.27    Cardiac Enzymes  Recent Labs Lab 11/03/14 1116  TROPONINI <0.03   Urinalysis    Component Value Date/Time   COLORURINE STRAW* 11/03/2014 1116   APPEARANCEUR CLEAR* 11/03/2014 1116   LABSPEC 1.011 11/03/2014 1116   PHURINE 6.0 11/03/2014 1116   GLUCOSEU 50* 11/03/2014 1116   [place] 11/03/2014 1116   [place] 11/03/2014 1116   KETONESUR NEGATIVE 11/03/2014 1116   PROTEINUR 100* 11/03/2014 1116   NITRITE NEGATIVE 11/03/2014 1116   LEUKOCYTESUR NEGATIVE 11/03/2014 1116     RADIOLOGY: Ct Head Wo Contrast  11/03/2014   CLINICAL DATA:  Recent falls.  Dementia.  EXAM: CT HEAD WITHOUT CONTRAST  TECHNIQUE:  Contiguous axial images were obtained from the base of the skull through the vertex without contrast.  COMPARISON:  07/27/2013  FINDINGS: Stable cerebral atrophy. Stable calcifications in the basal ganglia. Stable low-density throughout the periventricular white matter and subcortical white matter. No evidence for acute hemorrhage, mass lesion, midline shift, hydrocephalus or large infarct. Visualized paranasal sinuses are clear. No acute bone abnormality.  IMPRESSION: No acute intracranial abnormality.  Stable atrophy.  Evidence for chronic small vessel ischemic changes.   Electronically Signed   By: Markus Daft M.D.   On: 11/03/2014 11:46   Dg Chest Portable 1 View  11/03/2014   CLINICAL DATA:  Emesis for 2 days  EXAM: PORTABLE CHEST - 1 VIEW  COMPARISON:  07/27/2013  FINDINGS: Cardiac shadow is within normal limits. The lungs are well aerated bilaterally. Some patchy scarring is again seen in the right mid lung. No bony abnormality is noted.  IMPRESSION: No acute abnormality seen.   Electronically Signed   By: Inez Catalina M.D.   On: 11/03/2014 12:14    IMPRESSION AND PLAN:  * Dehydration  Due to decreased oral intake  We will do symptomatic treatment of his nausea and vomiting and encourage fluids.  I will not give IV fluids because of his high blood pressure.   * Nausea, vomit   mostly viral gastritis.   Will give zofran.  * Generalized weakness and fall    need physical therapy evaluation  * Acute worsening of chronic renal failure   Continue monitoring, however avoid nephrotoxic meds.  * Atrial fibrillation   Cardizem for rate control and continue Coumadin as per pharmacy.  * Uncontrolled hypertension   Daughter did not give him his morning dose of medications today.   Continue losartan and Cardizem, will give hydralazine injection as needed.     All the records are reviewed and case discussed with ED provider. Management plans discussed with the patient, family and they are in  agreement.  CODE STATUS: DNR- daughter is HCPOA- she confirmed to me.  TOTAL TIME TAKING CARE OF THIS PATIENT: 50 minutes.    Vaughan Basta M.D on 11/03/2014   Between 7am to 6pm - Pager - [phone]  After 6pm go to [email or web address] - password [email or web address] [facility]  [facility]  Office  [phone]  CC: Primary care physician; Ria Bush, MD

## 2014-11-04 DIAGNOSIS — E860 Dehydration: Secondary | ICD-10-CM | POA: Diagnosis not present

## 2014-11-04 LAB — CBC
HEMATOCRIT: 27.1 % — AB (ref 40.0–52.0)
Hemoglobin: 8.9 g/dL — ABNORMAL LOW (ref 13.0–18.0)
MCH: 31.1 pg (ref 26.0–34.0)
MCHC: 32.9 g/dL (ref 32.0–36.0)
MCV: 94.5 fL (ref 80.0–100.0)
Platelets: 197 10*3/uL (ref 150–440)
RBC: 2.87 MIL/uL — AB (ref 4.40–5.90)
RDW: 13.3 % (ref 11.5–14.5)
WBC: 5.6 10*3/uL (ref 3.8–10.6)

## 2014-11-04 LAB — BASIC METABOLIC PANEL
ANION GAP: 4 — AB (ref 5–15)
BUN: 51 mg/dL — ABNORMAL HIGH (ref 6–20)
CALCIUM: 8.2 mg/dL — AB (ref 8.9–10.3)
CO2: 26 mmol/L (ref 22–32)
CREATININE: 3.64 mg/dL — AB (ref 0.61–1.24)
Chloride: 112 mmol/L — ABNORMAL HIGH (ref 101–111)
GFR calc Af Amer: 17 mL/min — ABNORMAL LOW (ref >60)
GFR calc non Af Amer: 14 mL/min — ABNORMAL LOW (ref >60)
GLUCOSE: 103 mg/dL — AB (ref 65–99)
POTASSIUM: 4.9 mmol/L (ref 3.5–5.1)
SODIUM: 142 mmol/L (ref 135–145)

## 2014-11-04 LAB — PROTIME-INR
INR: 2.47
Prothrombin Time: 26.9 seconds — ABNORMAL HIGH (ref 11.4–15.0)

## 2014-11-04 NOTE — Progress Notes (Signed)
ANTICOAGULATION CONSULT NOTE - Follow up  Pharmacy Consult for Warfarin Indication: atrial fibrillation  Allergies  Allergen Reactions  . Pradaxa [Dabigatran Etexilate Mesylate] Other (See Comments)    Patient Measurements: Height: 5\' 6"  (167.6 cm) Weight: 130 lb (58.968 kg) IBW/kg (Calculated) : 63.8 Heparin Dosing Weight:   Vital Signs: Temp: 97.7 F (36.5 C) (06/25 0755) Temp Source: Oral (06/25 0755) BP: 166/67 mmHg (06/25 0755) Pulse Rate: 73 (06/25 0755)  Labs:  Recent Labs  11/03/14 1116 11/04/14 0413 11/04/14 0846  HGB 10.2* 8.9*  --   HCT 31.0* 27.1*  --   PLT 232 197  --   LABPROT 25.2*  --  26.9*  INR 2.27  --  2.47  CREATININE 3.45* 3.64*  --   TROPONINI <0.03  --   --     Estimated Creatinine Clearance: 13.3 mL/min (by C-G formula based on Cr of 3.64).   Medical History: Past Medical History  Diagnosis Date  . Undiagnosed cardiac murmurs   . CAD (coronary artery disease)   . Other and unspecified hyperlipidemia   . Unspecified essential hypertension   . Diaphragmatic hernia without mention of obstruction or gangrene   . Sensory hearing loss, unilateral   . Colon polyps   . Impaired fasting glucose   . Esophageal reflux   . Elevated prostate specific antigen (PSA)   . Unspecified disorder resulting from impaired renal function   . Unspecified disorder of kidney and ureter   . Alzheimer's dementia     Medications:  Prescriptions prior to admission  Medication Sig Dispense Refill Last Dose  . acetaminophen (TYLENOL) 500 MG tablet Take 1,000 mg by mouth every 6 (six) hours as needed for mild pain or headache.   10/30/2014  . diltiazem (CARDIZEM CD) 120 MG 24 hr capsule Take 120 mg by mouth 2 (two) times daily.   11/02/2014 at pm  . finasteride (PROSCAR) 5 MG tablet Take 5 mg by mouth daily.   11/02/2014 at am  . furosemide (LASIX) 20 MG tablet Take 20 mg by mouth daily.   11/02/2014 at am  . galantamine (RAZADYNE) 8 MG tablet Take 8 mg by mouth  2 (two) times daily with a meal.   11/02/2014 at pm  . losartan (COZAAR) 100 MG tablet Take 100 mg by mouth daily.   11/02/2014 at am  . pantoprazole (PROTONIX) 40 MG tablet Take 40 mg by mouth 2 (two) times daily.   11/02/2014 at pm  . PARoxetine (PAXIL) 10 MG tablet Take 10 mg by mouth every morning.   11/02/2014 at am  . warfarin (COUMADIN) 3 MG tablet Take 1.5-3 mg by mouth at bedtime. 0.5 tablet on Monday, Wednesday, and Friday; 1 tablet on Tuesday, Thursday, Saturday, and Sunday   11/02/2014 at pm    Assessment: INR =2.47  Goal of Therapy:  INR 2-3   Plan: Will continue pt on home warfarin dose:  Warfarin 1.5 mg PO Mon - Wed - Fri.  Warfarin 3 mg PO Tues-Thurs-Sat-Sun.  Will order INR for am  Chinita Greenland PharmD Clinical Pharmacist 11/04/2014

## 2014-11-04 NOTE — Discharge Instructions (Signed)
Continue to encourage oral fluid intake Call to schedule follow up with Dr Sabra Heck within 1-2 weeks

## 2014-11-04 NOTE — Progress Notes (Signed)
Pt is more alert. Ambulated to bathroom with 1 assist. Rested quietly with granddaughter at bedside.

## 2014-11-04 NOTE — Plan of Care (Signed)
Problem: Acute Rehab PT Goals(only PT should resolve) Goal: Patient Will Transfer Sit To/From Stand Pt will transfer sit to/from-stand without AD at [place] without loss-of-balance to demonstrate good safety awareness for independent mobility in home.     Goal: Pt Will Ambulate Pt will ambulate at [facility] using a step-through pattern and equal step length for a distances greater than 242ft without LOB to demonstrate the ability to perform safe household distance ambulation at discharge.

## 2014-11-04 NOTE — Discharge Summary (Signed)
Physician Discharge Summary  Patient ID: Derek [name] MRN: [id number] DOB/AGE: [birth year] 81 y.o.  Admit date: 11/03/2014 Discharge date: 11/04/2014  Admission Diagnoses:  Discharge Diagnoses:  Active Problems:   Dehydration   Discharged Condition: fair  Hospital Course:  Derek [name] is a 81 y.o. male with a known history of hypertension hyperlipidemia, diverticulitis, coronary artery disease, chronic kidney disease, normocytic anemia, aortic stenosis, BPH, atrial fibrillation on Coumadin- has gradual worsening of his progressive aphasia and dementia, so for last 2 years he is living with his daughter and she is taking care of him.  He came in after nv poor PO intake and falls.  CXR, CT head negate. No leukocytosis or fevers.   1. Vral GE - resolved 2. AoCKD - continue oral hydration  Recheck as otpt 3. Falls- pt rec hh  Consults: None  Significant Diagnostic Studies: labs:   Treatments: IV hydration  Discharge Exam: Blood pressure 166/67, pulse 73, temperature 97.7 F (36.5 C), temperature source Oral, resp. rate 17, height 5\' 6"  (1.676 m), weight 58.968 kg (130 lb), SpO2 99 %. See note Disposition:      Medication List    TAKE these medications        acetaminophen 500 MG tablet  Commonly known as:  TYLENOL  Take 1,000 mg by mouth every 6 (six) hours as needed for mild pain or headache.     diltiazem 120 MG 24 hr capsule  Commonly known as:  CARDIZEM CD  Take 120 mg by mouth 2 (two) times daily.     finasteride 5 MG tablet  Commonly known as:  PROSCAR  Take 5 mg by mouth daily.     furosemide 20 MG tablet  Commonly known as:  LASIX  Take 20 mg by mouth daily.     galantamine 8 MG tablet  Commonly known as:  RAZADYNE  Take 8 mg by mouth 2 (two) times daily with a meal.     losartan 100 MG tablet  Commonly known as:  COZAAR  Take 100 mg by mouth daily.     pantoprazole 40 MG tablet  Commonly known as:  PROTONIX  Take 40 mg by mouth 2 (two) times  daily.     PARoxetine 10 MG tablet  Commonly known as:  PAXIL  Take 10 mg by mouth every morning.     warfarin 3 MG tablet  Commonly known as:  COUMADIN  Take 1.5-3 mg by mouth at bedtime. 0.5 tablet on Monday, Wednesday, and Friday; 1 tablet on Tuesday, Thursday, Saturday, and Sunday         Signed: [place], [place] 11/04/2014, 11:52 AM

## 2014-11-04 NOTE — Progress Notes (Signed)
Clinical Social Worker (CSW) received SNF consult. PT is recommending home health. RN Case Manager aware of above. Please reconsult if future social work needs arise. CSW signing off.   Ilona Colley Morgan, LCSWA [phone] 

## 2014-11-04 NOTE — Progress Notes (Signed)
Patient is alert & oriented this morning. Dr. Ola Spurr is discharging to home with home health. Daughter bedside and ready to take pt home.  Iv removed and patient's home medications returned. Belongings packed. Discharge instructions reviewed.

## 2014-11-04 NOTE — Progress Notes (Signed)
Initial Nutrition Assessment    INTERVENTION:   (Nutrition Supplement Therapy: ) Will add mightyshake TID for added nutrition  Meals and snacks: Cater to pt prefences   NUTRITION DIAGNOSIS:  Inadequate oral intake related to altered GI function as evidenced by other (see comment) (nausea, vomiting, diarrhea prior to admission).    GOAL:  Patient will meet greater than or equal to 90% of their needs    MONITOR:   (Energy intake, Gastrointestinal profile, Electrolyte and renal profile)  REASON FOR ASSESSMENT:  Malnutrition Screening Tool    ASSESSMENT:  Pt admitted with dehydration, 4 days of nausea, vomiting, questionable viral gastritis and poor po intake per MD note  PMHx:  Past Medical History  Diagnosis Date  . Undiagnosed cardiac murmurs   . CAD (coronary artery disease)   . Other and unspecified hyperlipidemia   . Unspecified essential hypertension   . Diaphragmatic hernia without mention of obstruction or gangrene   . Sensory hearing loss, unilateral   . Colon polyps   . Impaired fasting glucose   . Esophageal reflux   . Elevated prostate specific antigen (PSA)   . Unspecified disorder resulting from impaired renal function   . Unspecified disorder of kidney and ureter   . Alzheimer's dementia     Diet Order: 2 gm Na  Current Nutrition: unable to speak with pt or family this am.  Nursing working with pt during rounds this am  Food/Nutrition-Related History: 4 day poor po intake prior to admission per note   Medications: coumadin  Electrolyte/Renal Profile and Glucose Profile:   Recent Labs Lab 11/03/14 1116 11/04/14 0413  NA 141 142  K 5.0 4.9  CL 109 112*  CO2 23 26  BUN 47* 51*  CREATININE 3.45* 3.64*  CALCIUM 8.9 8.2*  GLUCOSE 95 103*   Protein Profile:  Recent Labs Lab 11/03/14 1116  ALBUMIN 3.8    Last BM: 6/23   Nutrition-Focused Physical Exam Findings:  Unable to complete Nutrition-Focused physical exam at this time.      Weight Change: unsure if any weight change in the last year Reviewed wt encounters below  Height:  Ht Readings from Last 1 Encounters:  11/03/14 5\' 6"  (1.676 m)    Weight:  Wt Readings from Last 1 Encounters:  11/03/14 130 lb (58.968 kg)      Wt Readings from Last 10 Encounters:  11/03/14 130 lb (58.968 kg)  10/01/06 155 lb 1.9 oz (70.361 kg)  12/29/95 186 lb (84.369 kg)    BMI:  Body mass index is 20.99 kg/(m^2).  Estimated Nutritional Needs:  Kcal:  BEE 1237 kcals (IF 1.0-1.2, AF 1.3) 7116-5790 kcals/d.   Protein:  (1.0-1.2 g/d)59-71 g/d  Fluid:  (25-7ml/kg) 1475-1710ml/d  Skin:  Reviewed, no issues  Diet Order:  Diet 2 gram sodium Room service appropriate?: Yes; Fluid consistency:: Thin  EDUCATION NEEDS:  No education needs identified at this time   Intake/Output Summary (Last 24 hours) at 11/04/14 0902 Last data filed at 11/04/14 0000  Gross per 24 hour  Intake      0 ml  Output      0 ml  Net      0 ml    MODERATE Care Level Derek [name] B. Zenia Resides, [place], [place] (pager)

## 2014-11-04 NOTE — Progress Notes (Signed)
[place]  PROGRESS NOTE Date of Admission:  11/03/2014     ID: Leverett L Bougher is a 81 y.o. male with dementia admitted with dehydration following likely viral GE.  Active Problems:   Dehydration  Subjective: Afebrile, BP has been elevated overnight. Ambulated some to bathroom. NO further nv. Eating "like a horse" per daughter at bedside  ROS  Eleven systems are reviewed and negative except per hpi  Medications:  Antibiotics Given (last 72 hours)    None     . diltiazem  120 mg Oral BID  . finasteride  5 mg Oral Daily  . losartan  100 mg Oral Daily  . pantoprazole  40 mg Oral BID  . PARoxetine  10 mg Oral BH-q7a  . [START ON 11/06/2014] warfarin  1.5 mg Oral Q M,W,F-1800  . warfarin  3 mg Oral Q T,Th,S,Su  . Warfarin - Pharmacist Dosing Inpatient   Does not apply q1800    Objective: Vital signs in last 24 hours: Temp:  [97.5 F (36.4 C)-98.4 F (36.9 C)] 97.7 F (36.5 C) (06/25 0755) Pulse Rate:  [63-82] 73 (06/25 0755) Resp:  [12-18] 17 (06/25 0755) BP: (147-213)/(64-89) 166/67 mmHg (06/25 0755) SpO2:  [93 %-100 %] 99 % (06/25 0755)  GENERAL: 81 y.o.-year-old patient lying in the bed with no acute distress.  EYES: Pupils equal, round, reactive to light and accommodation. No scleral icterus. Extraocular muscles intact.  HEENT: Head atraumatic, normocephalic. Oropharynx and nasopharynx clear. Hearing aid in use.  NECK: Supple, no jugular venous distention. No thyroid enlargement, no tenderness.  LUNGS: Normal breath sounds bilaterally, no wheezing, rales,rhonchi or crepitation. No use of accessory muscles of respiration.  CARDIOVASCULAR: S1, S2 normal. positive murmurs, no gallops.  ABDOMEN: Soft, nontender, nondistended. Bowel sounds present. No organomegaly or mass.  EXTREMITIES: No pedal edema, cyanosis, or clubbing.  NEUROLOGIC: Cranial nerves II through XII are intact. Muscle strength 4/5 in all extremities- generalized weakness. Sensation intact. Gait  not checked.  PSYCHIATRIC: The patient is alert , hearing deficit, have fluctuating orientation. SKIN: No obvious rash, lesion, or ulcer.   Lab Results  Recent Labs  11/03/14 1116 11/04/14 0413  WBC 7.4 5.6  HGB 10.2* 8.9*  HCT 31.0* 27.1*  NA 141 142  K 5.0 4.9  CL 109 112*  CO2 23 26  BUN 47* 51*  CREATININE 3.45* 3.64*    Microbiology: No results found for this or any previous visit.  Studies/Results: Ct Head Wo Contrast  11/03/2014   CLINICAL DATA:  Recent falls.  Dementia.  EXAM: CT HEAD WITHOUT CONTRAST  TECHNIQUE: Contiguous axial images were obtained from the base of the skull through the vertex without contrast.  COMPARISON:  07/27/2013  FINDINGS: Stable cerebral atrophy. Stable calcifications in the basal ganglia. Stable low-density throughout the periventricular white matter and subcortical white matter. No evidence for acute hemorrhage, mass lesion, midline shift, hydrocephalus or large infarct. Visualized paranasal sinuses are clear. No acute bone abnormality.  IMPRESSION: No acute intracranial abnormality.  Stable atrophy.  Evidence for chronic small vessel ischemic changes.   Electronically Signed   By: Markus Daft M.D.   On: 11/03/2014 11:46   Dg Chest Portable 1 View  11/03/2014   CLINICAL DATA:  Emesis for 2 days  EXAM: PORTABLE CHEST - 1 VIEW  COMPARISON:  07/27/2013  FINDINGS: Cardiac shadow is within normal limits. The lungs are well aerated bilaterally. Some patchy scarring is again seen in the right mid lung. No bony abnormality is noted.  IMPRESSION: No acute abnormality seen.   Electronically Signed   By: Inez Catalina M.D.   On: 11/03/2014 12:14    Assessment/Plan: Jasim Milberger is a 81 y.o. male with a known history of hypertension hyperlipidemia, diverticulitis, coronary artery disease, chronic kidney disease, normocytic anemia, aortic stenosis, BPH, atrial fibrillation on Coumadin- has gradual worsening of his progressive aphasia and dementia, so for last 2  years he is living with his daughter and she is taking care of him.  He came in after nv poor PO intake and falls.  CXR, CT head negate. No leukocytosis or fevers.   1. Vral GE - resolved 2. AoCKD - continue oral hydration  Recheck as otpt 3. Falls- pt rec hh Thank you very much for the consult. Will follow with you.  [place], [place]   11/04/2014, 11:40 AM

## 2014-11-04 NOTE — Care Management Note (Signed)
Case Management Note  Patient Details  Name: Derek [name] MRN: [id number] Date of Birth: 1930/08/09  Subjective/Objective:   Referral called and faxed to [facility] requesting PT and RN. Daughter Derek [name] ph [phone] chose [facility] to be her father's home health provider. Mr Mckeithan will be residing with his daughter Derek [name] after discharge at [address], [place], [place] [address]. [facility] has been updated per change of residence.                  Action/Plan:   Expected Discharge Date:                  Expected Discharge Plan:     In-House Referral:     Discharge planning Services     Post Acute Care Choice:    Choice offered to:     DME Arranged:    DME Agency:     HH Arranged:    [place] Agency:     Status of Service:     Medicare Important Message Given:    Date Medicare IM Given:    Medicare IM give by:    Date Additional Medicare IM Given:    Additional Medicare Important Message give by:     If discussed at [place] of Stay Meetings, dates discussed:    Additional Comments:  Blondine Hottel A, RN 11/04/2014, 1:57 PM

## 2014-11-04 NOTE — Evaluation (Signed)
Physical Therapy Evaluation Patient Details Name: Derek [name] MRN: [id number] DOB: 15-Feb-1931 Today's Date: 11/04/2014   History of Present Illness  Pt 81yo white male c history of moderately progressed dementia, with fluctuating global aphasia. Pt lives with daughter who is primary caregiver, with 24/7 supervision. Pt recently returned from beach trip c weakness, N/V, and general malaise, at which point daughter brought pt to hospital . Pt found to be dehydrated and is now being treated.   Clinical Impression  Pt is received sitting at EOB c NA upon entry, awake, alert, and eager to mobilize. No acute distress noted. Pt is A&Ox person only.Pt is moderately demented and thus history is obtained from family. Pt strength is generally weak in upper and lower quarters as noted during functional mobility assessment. Pt falls risk is high as evidenced by slow gait speed, standing balance, and altered posturing during transfers and mobility. Patient presenting with impairment of strength, range of motion, balance, and activity tolerance, limiting ability to perform ADL and mobility tasks at  baseline level of function. Patient will benefit from skilled intervention to address the above impairments and limitations, in order to restore to prior level of function, improve patient safety upon discharge, and to decrease falls risk.       Follow Up Recommendations Home health PT    Equipment Recommendations  None recommended by PT    Recommendations for Other Services       Precautions / Restrictions Precautions Precautions: Fall Restrictions Weight Bearing Restrictions: No      Mobility  Bed Mobility Overal bed mobility: Modified Independent Bed Mobility: Supine to Sit     Supine to sit: Supervision     General bed mobility comments: heavy verbal cues to keep pt on task. Responds well to visual cues.   Transfers Overall transfer level: Needs assistance Equipment used: 1 person hand held  assist Transfers: Sit to/from Stand Sit to Stand: Min assist         General transfer comment: MinA for Balance.   Ambulation/Gait Ambulation/Gait assistance: Min assist Ambulation Distance (Feet): 100 Feet Assistive device: 1 person hand held assist     Gait velocity interpretation: <1.8 ft/sec, indicative of risk for recurrent falls General Gait Details: Reluctance to FWB on LLE, c constant R lateral lean, very unsteady on feet c 3 LOB, able to self correct. Slow and distracted, must be kept on task.   Stairs            Wheelchair Mobility    Modified Rankin (Stroke Patients Only)       Balance Overall balance assessment: History of Falls;Needs assistance Sitting-balance support: Feet supported;No upper extremity supported Sitting balance-Leahy Scale: Good   Postural control: Right lateral lean Standing balance support: Single extremity supported                     Standardized Balance Assessment Standardized Balance Assessment :  (Pt is unable to follow commands enough to participate in standardized assessment at this time,. )           Pertinent Vitals/Pain Pain Assessment: No/denies pain    Home Living Family/patient expects to be discharged to:: Private residence Living Arrangements: Children Available Help at Discharge: Family (daughter, granddaughter) Type of Home: House Home Access: Stairs to enter Entrance Stairs-Rails: None Entrance Stairs-Number of Steps: 3 Home Layout: One level   Additional Comments: Railing being installed soon.     Prior Function Level of Independence: Needs assistance  Gait / Transfers Assistance Needed: MinA with all mobility.   ADL's / Homemaking Assistance Needed: Mod-MaxA with all ADL.         Hand Dominance        Extremity/Trunk Assessment   Upper Extremity Assessment: Generalized weakness           Lower Extremity Assessment: Generalized weakness (Pt is avoiding weightbearing on  LLE, s clear reasoning. Family confirms is new phenomenon)      Cervical / Trunk Assessment:  (leaning R, forward flexed. )  Communication   Communication: HOH  Cognition Arousal/Alertness: Awake/alert Behavior During Therapy: WFL for tasks assessed/performed;Impulsive (Highly distactable. ) Overall Cognitive Status: History of cognitive impairments - at baseline       Memory: Decreased short-term memory;Decreased recall of precautions              General Comments      Exercises        Assessment/Plan    PT Assessment Patient needs continued PT services  PT Diagnosis Generalized weakness;Altered mental status;Abnormality of gait   PT Problem List Decreased strength;Decreased knowledge of use of DME;Decreased activity tolerance;Decreased safety awareness;Decreased balance;Decreased mobility  PT Treatment Interventions Balance training;Gait training;Stair training;Functional mobility training;Therapeutic activities;Therapeutic exercise   PT Goals (Current goals can be found in the Care Plan section) Acute Rehab PT Goals Patient Stated Goal: caregivers would like to take pt home and receive HHPT there.  PT Goal Formulation: Patient unable to participate in goal setting Time For Goal Achievement: 11/18/14 Potential to Achieve Goals: Good    Frequency Min 2X/week   Barriers to discharge        Co-evaluation               End of Session Equipment Utilized During Treatment: Gait belt Activity Tolerance: Patient tolerated treatment well;No increased pain Patient left: in bed;with bed alarm set;with family/visitor present Nurse Communication: Mobility status         Time: 0349-1791 PT Time Calculation (min) (ACUTE ONLY): 24 min   Charges:   PT Evaluation $Initial PT Evaluation Tier I: 1 Procedure     PT G Codes:        Arian Murley C Nov 19, 2014, 1:14 PM  1:17 PM  Etta Grandchild, PT, DPT [place] License # [id number]

## 2014-11-04 NOTE — Progress Notes (Signed)
Pt's granddaughter refused TEDS. Educated her on the benefits and risk

## 2014-11-06 NOTE — Progress Notes (Signed)
   11/04/14 1313  Acute Rehab PT Goals  Patient Stated Goal caregivers would like to take pt home and receive HHPT there.   PT Goal Formulation Patient unable to participate in goal setting  Time For Goal Achievement 11/18/14  Potential to Achieve Goals Good  PT Time Calculation  PT Start Time (ACUTE ONLY) 1043  PT Stop Time (ACUTE ONLY) 1107  PT Time Calculation (min) (ACUTE ONLY) 24 min  PT G-Codes **NOT FOR INPATIENT CLASS**  Functional Assessment Tool Used clinical judgment   Functional Limitation Mobility: Walking and moving around  Mobility: Walking and Moving Around Current Status (I3437) CK  Mobility: Walking and Moving Around Goal Status (D5789) CJ  PT General Charges  $$ ACUTE PT VISIT 1 Procedure  PT Evaluation  $Initial PT Evaluation Tier I 1 Procedure   Late entry G-codes entered.  9:30 AM  Etta Grandchild, PT, DPT [place] License # [id number]

## 2014-11-21 ENCOUNTER — Encounter: Payer: Self-pay | Admitting: Emergency Medicine

## 2014-11-21 ENCOUNTER — Emergency Department: Payer: Medicare Other

## 2014-11-21 DIAGNOSIS — N183 Chronic kidney disease, stage 3 (moderate): Secondary | ICD-10-CM | POA: Insufficient documentation

## 2014-11-21 DIAGNOSIS — S51002A Unspecified open wound of left elbow, initial encounter: Secondary | ICD-10-CM | POA: Diagnosis not present

## 2014-11-21 DIAGNOSIS — G309 Alzheimer's disease, unspecified: Secondary | ICD-10-CM | POA: Insufficient documentation

## 2014-11-21 DIAGNOSIS — Z79899 Other long term (current) drug therapy: Secondary | ICD-10-CM | POA: Diagnosis not present

## 2014-11-21 DIAGNOSIS — W01198A Fall on same level from slipping, tripping and stumbling with subsequent striking against other object, initial encounter: Secondary | ICD-10-CM | POA: Diagnosis not present

## 2014-11-21 DIAGNOSIS — Z7901 Long term (current) use of anticoagulants: Secondary | ICD-10-CM | POA: Insufficient documentation

## 2014-11-21 DIAGNOSIS — Y998 Other external cause status: Secondary | ICD-10-CM | POA: Insufficient documentation

## 2014-11-21 DIAGNOSIS — I129 Hypertensive chronic kidney disease with stage 1 through stage 4 chronic kidney disease, or unspecified chronic kidney disease: Secondary | ICD-10-CM | POA: Diagnosis not present

## 2014-11-21 DIAGNOSIS — Y9389 Activity, other specified: Secondary | ICD-10-CM | POA: Diagnosis not present

## 2014-11-21 DIAGNOSIS — Z8719 Personal history of other diseases of the digestive system: Secondary | ICD-10-CM | POA: Diagnosis not present

## 2014-11-21 DIAGNOSIS — Y92410 Unspecified street and highway as the place of occurrence of the external cause: Secondary | ICD-10-CM | POA: Insufficient documentation

## 2014-11-21 DIAGNOSIS — Z87891 Personal history of nicotine dependence: Secondary | ICD-10-CM | POA: Diagnosis not present

## 2014-11-21 DIAGNOSIS — F0280 Dementia in other diseases classified elsewhere without behavioral disturbance: Secondary | ICD-10-CM | POA: Diagnosis not present

## 2014-11-21 DIAGNOSIS — S59902A Unspecified injury of left elbow, initial encounter: Secondary | ICD-10-CM | POA: Diagnosis present

## 2014-11-21 DIAGNOSIS — S5002XA Contusion of left elbow, initial encounter: Secondary | ICD-10-CM | POA: Insufficient documentation

## 2014-11-21 NOTE — ED Notes (Addendum)
Pt presents to ER alert and in NAD. Pt had a mechanical fall today. Pt has hematoma noted to left elbow. Pt has dementia. Denies further injury.

## 2014-11-21 NOTE — ED Notes (Signed)
Ice pack applied to left elbow.

## 2014-11-22 ENCOUNTER — Emergency Department
Admission: EM | Admit: 2014-11-22 | Discharge: 2014-11-22 | Disposition: A | Discharge status: Home / Self Care | Payer: Medicare Other | Attending: Emergency Medicine | Admitting: Emergency Medicine

## 2014-11-22 DIAGNOSIS — S5002XA Contusion of left elbow, initial encounter: Secondary | ICD-10-CM

## 2014-11-22 DIAGNOSIS — W19XXXA Unspecified fall, initial encounter: Secondary | ICD-10-CM

## 2014-11-22 DIAGNOSIS — S51012A Laceration without foreign body of left elbow, initial encounter: Secondary | ICD-10-CM
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
## 2014-11-22 NOTE — Discharge Instructions (Signed)
Elbow Contusion An elbow contusion is a deep bruise of the elbow. Contusions are the result of an injury that caused bleeding under the skin. The contusion may turn blue, purple, or yellow. Minor injuries will give you a painless contusion, but more severe contusions may stay painful and swollen for a few weeks.  CAUSES  An elbow contusion comes from a direct force to that area, such as falling on the elbow. SYMPTOMS   Swelling and redness of the elbow.  Bruising of the elbow area.  Tenderness or soreness of the elbow. DIAGNOSIS  You will have a physical exam and will be asked about your history. You may need an X-ray of your elbow to look for a broken bone (fracture).  TREATMENT  A sling or splint may be needed to support your injury. Resting, elevating, and applying cold compresses to the elbow area are often the best treatments for an elbow contusion. Over-the-counter medicines may also be recommended for pain control. HOME CARE INSTRUCTIONS   Put ice on the injured area.  Put ice in a plastic bag.  Place a towel between your skin and the bag.  Leave the ice on for 15-20 minutes, 03-04 times a day.  Only take over-the-counter or prescription medicines for pain, discomfort, or fever as directed by your caregiver.  Rest your injured elbow until the pain and swelling are better.  Elevate your elbow to reduce swelling.  Apply a compression wrap as directed by your caregiver. This can help reduce swelling and motion. You may remove the wrap for sleeping, showers, and baths. If your fingers become numb, cold, or blue, take the wrap off and reapply it more loosely.  Use your elbow only as directed by your caregiver. You may be asked to do range of motion exercises. Do them as directed.  See your caregiver as directed. It is very important to keep all follow-up appointments in order to avoid any long-term problems with your elbow, including chronic pain or inability to move your elbow  normally. SEEK IMMEDIATE MEDICAL CARE IF:   You have increased redness, swelling, or pain in your elbow.  Your swelling or pain is not relieved with medicines.  You have swelling of the hand and fingers.  You are unable to move your fingers or wrist.  You begin to lose feeling in your hand or fingers.  Your fingers or hand become cold or blue. MAKE SURE YOU:   Understand these instructions.  Will watch your condition.  Will get help right away if you are not doing well or get worse. Document Released: 04/06/2006 Document Revised: 07/21/2011 Document Reviewed: 03/14/2011 [facility] Patient Information 2015 [place], [place]. This information is not intended to replace advice given to you by your health care provider. Make sure you discuss any questions you have with your health care provider.  Skin Tear Care A skin tear is a wound in which the top layer of skin has peeled off. This is a common problem with aging because the skin becomes thinner and more fragile as a person gets older. In addition, some medicines, such as oral corticosteroids, can lead to skin thinning if taken for long periods of time.  A skin tear is often repaired with tape or skin adhesive strips. This keeps the skin that has been peeled off in contact with the healthier skin beneath. Depending on the location of the wound, a bandage (dressing) may be applied over the tape or skin adhesive strips. Sometimes, during the healing process,  the skin turns black and dies. Even when this happens, the torn skin acts as a good dressing until the skin underneath gets healthier and repairs itself. HOME CARE INSTRUCTIONS   Change dressings once per day or as directed by your caregiver.  Gently clean the skin tear and the area around the tear using saline solution or mild soap and water.  Do not rub the injured skin dry. Let the area air dry.  Apply petroleum jelly or an antibiotic cream or ointment to keep the tear moist. This  will help the wound heal. Do not allow a scab to form.  If the dressing sticks before the next dressing change, moisten it with warm soapy water and gently remove it.  Protect the injured skin until it has healed.  Only take over-the-counter or prescription medicines as directed by your caregiver.  Take showers or baths using warm soapy water. Apply a new dressing after the shower or bath.  Keep all follow-up appointments as directed by your caregiver.  SEEK IMMEDIATE MEDICAL CARE IF:   You have redness, swelling, or increasing pain in the skin tear.  You havepus coming from the skin tear.  You have chills.  You have a red streak that goes away from the skin tear.  You have a bad smell coming from the tear or dressing.  You have a fever or persistent symptoms for more than 2-3 days.  You have a fever and your symptoms suddenly get worse. MAKE SURE YOU:  Understand these instructions.  Will watch this condition.  Will get help right away if your child is not doing well or gets worse. Document Released: 01/21/2001 Document Revised: 01/21/2012 Document Reviewed: 11/10/2011 [facility] Patient Information 2015 [place], [place]. This information is not intended to replace advice given to you by your health care provider. Make sure you discuss any questions you have with your health care provider.   Fall Prevention and Home Safety Falls cause injuries and can affect all age groups. It is possible to use preventive measures to significantly decrease the likelihood of falls. There are many simple measures which can make your home safer and prevent falls. OUTDOORS  Repair cracks and edges of walkways and driveways.  Remove high doorway thresholds.  Trim shrubbery on the main path into your home.  Have good outside lighting.  Clear walkways of tools, rocks, debris, and clutter.  Check that handrails are not broken and are securely fastened. Both sides of steps should have  handrails.  Have leaves, snow, and ice cleared regularly.  Use sand or salt on walkways during winter months.  In the garage, clean up grease or oil spills. BATHROOM  Install night lights.  Install grab bars by the toilet and in the tub and shower.  Use non-skid mats or decals in the tub or shower.  Place a plastic non-slip stool in the shower to sit on, if needed.  Keep floors dry and clean up all water on the floor immediately.  Remove soap buildup in the tub or shower on a regular basis.  Secure bath mats with non-slip, double-sided rug tape.  Remove throw rugs and tripping hazards from the floors. BEDROOMS  Install night lights.  Make sure a bedside light is easy to reach.  Do not use oversized bedding.  Keep a telephone by your bedside.  Have a firm chair with side arms to use for getting dressed.  Remove throw rugs and tripping hazards from the floor. KITCHEN  Keep handles on  pots and pans turned toward the center of the stove. Use back burners when possible.  Clean up spills quickly and allow time for drying.  Avoid walking on wet floors.  Avoid hot utensils and knives.  Position shelves so they are not too high or low.  Place commonly used objects within easy reach.  If necessary, use a sturdy step stool with a grab bar when reaching.  Keep electrical cables out of the way.  Do not use floor polish or wax that makes floors slippery. If you must use wax, use non-skid floor wax.  Remove throw rugs and tripping hazards from the floor. STAIRWAYS  Never leave objects on stairs.  Place handrails on both sides of stairways and use them. Fix any loose handrails. Make sure handrails on both sides of the stairways are as long as the stairs.  Check carpeting to make sure it is firmly attached along stairs. Make repairs to worn or loose carpet promptly.  Avoid placing throw rugs at the top or bottom of stairways, or properly secure the rug with carpet  tape to prevent slippage. Get rid of throw rugs, if possible.  Have an electrician put in a light switch at the top and bottom of the stairs. OTHER FALL PREVENTION TIPS  Wear low-heel or rubber-soled shoes that are supportive and fit well. Wear closed toe shoes.  When using a stepladder, make sure it is fully opened and both spreaders are firmly locked. Do not climb a closed stepladder.  Add color or contrast paint or tape to grab bars and handrails in your home. Place contrasting color strips on first and last steps.  Learn and use mobility aids as needed. Install an electrical emergency response system.  Turn on lights to avoid dark areas. Replace light bulbs that burn out immediately. Get light switches that glow.  Arrange furniture to create clear pathways. Keep furniture in the same place.  Firmly attach carpet with non-skid or double-sided tape.  Eliminate uneven floor surfaces.  Select a carpet pattern that does not visually hide the edge of steps.  Be aware of all pets. OTHER HOME SAFETY TIPS  Set the water temperature for 120 F (48.8 C).  Keep emergency numbers on or near the telephone.  Keep smoke detectors on every level of the home and near sleeping areas. Document Released: 04/18/2002 Document Revised: 10/28/2011 Document Reviewed: 07/18/2011 [facility] Patient Information 2015 [place], [place]. This information is not intended to replace advice given to you by your health care provider. Make sure you discuss any questions you have with your health care provider.

## 2015-01-04 ENCOUNTER — Emergency Department: Payer: Medicare Other

## 2015-01-04 ENCOUNTER — Encounter: Payer: Self-pay | Admitting: Emergency Medicine

## 2015-01-04 ENCOUNTER — Inpatient Hospital Stay
Admission: EM | Admit: 2015-01-04 | Discharge: 2015-01-10 | DRG: 480 | Disposition: A | Discharge status: Hospice | Payer: Medicare Other | Attending: Internal Medicine | Admitting: Internal Medicine

## 2015-01-04 DIAGNOSIS — J95821 Acute postprocedural respiratory failure: Secondary | ICD-10-CM | POA: Diagnosis not present

## 2015-01-04 DIAGNOSIS — G309 Alzheimer's disease, unspecified: Secondary | ICD-10-CM | POA: Diagnosis present

## 2015-01-04 DIAGNOSIS — I129 Hypertensive chronic kidney disease with stage 1 through stage 4 chronic kidney disease, or unspecified chronic kidney disease: Secondary | ICD-10-CM | POA: Diagnosis present

## 2015-01-04 DIAGNOSIS — I2510 Atherosclerotic heart disease of native coronary artery without angina pectoris: Secondary | ICD-10-CM | POA: Diagnosis present

## 2015-01-04 DIAGNOSIS — R7301 Impaired fasting glucose: Secondary | ICD-10-CM | POA: Diagnosis present

## 2015-01-04 DIAGNOSIS — N189 Chronic kidney disease, unspecified: Secondary | ICD-10-CM | POA: Diagnosis not present

## 2015-01-04 DIAGNOSIS — N184 Chronic kidney disease, stage 4 (severe): Secondary | ICD-10-CM | POA: Diagnosis present

## 2015-01-04 DIAGNOSIS — J159 Unspecified bacterial pneumonia: Secondary | ICD-10-CM | POA: Diagnosis not present

## 2015-01-04 DIAGNOSIS — D509 Iron deficiency anemia, unspecified: Secondary | ICD-10-CM | POA: Diagnosis not present

## 2015-01-04 DIAGNOSIS — R4701 Aphasia: Secondary | ICD-10-CM | POA: Diagnosis present

## 2015-01-04 DIAGNOSIS — Z86010 Personal history of colonic polyps: Secondary | ICD-10-CM | POA: Diagnosis not present

## 2015-01-04 DIAGNOSIS — Z8041 Family history of malignant neoplasm of ovary: Secondary | ICD-10-CM | POA: Diagnosis not present

## 2015-01-04 DIAGNOSIS — K219 Gastro-esophageal reflux disease without esophagitis: Secondary | ICD-10-CM | POA: Diagnosis present

## 2015-01-04 DIAGNOSIS — F0280 Dementia in other diseases classified elsewhere without behavioral disturbance: Secondary | ICD-10-CM | POA: Diagnosis present

## 2015-01-04 DIAGNOSIS — Z87891 Personal history of nicotine dependence: Secondary | ICD-10-CM | POA: Diagnosis not present

## 2015-01-04 DIAGNOSIS — R972 Elevated prostate specific antigen [PSA]: Secondary | ICD-10-CM | POA: Diagnosis present

## 2015-01-04 DIAGNOSIS — Z515 Encounter for palliative care: Secondary | ICD-10-CM | POA: Diagnosis not present

## 2015-01-04 DIAGNOSIS — N170 Acute kidney failure with tubular necrosis: Secondary | ICD-10-CM | POA: Diagnosis not present

## 2015-01-04 DIAGNOSIS — Z791 Long term (current) use of non-steroidal anti-inflammatories (NSAID): Secondary | ICD-10-CM | POA: Diagnosis not present

## 2015-01-04 DIAGNOSIS — R011 Cardiac murmur, unspecified: Secondary | ICD-10-CM | POA: Diagnosis present

## 2015-01-04 DIAGNOSIS — Z951 Presence of aortocoronary bypass graft: Secondary | ICD-10-CM

## 2015-01-04 DIAGNOSIS — E440 Moderate protein-calorie malnutrition: Secondary | ICD-10-CM | POA: Diagnosis not present

## 2015-01-04 DIAGNOSIS — W06XXXA Fall from bed, initial encounter: Secondary | ICD-10-CM | POA: Diagnosis present

## 2015-01-04 DIAGNOSIS — Z803 Family history of malignant neoplasm of breast: Secondary | ICD-10-CM

## 2015-01-04 DIAGNOSIS — Z66 Do not resuscitate: Secondary | ICD-10-CM | POA: Diagnosis not present

## 2015-01-04 DIAGNOSIS — I482 Chronic atrial fibrillation: Secondary | ICD-10-CM | POA: Diagnosis present

## 2015-01-04 DIAGNOSIS — Z888 Allergy status to other drugs, medicaments and biological substances status: Secondary | ICD-10-CM

## 2015-01-04 DIAGNOSIS — N400 Enlarged prostate without lower urinary tract symptoms: Secondary | ICD-10-CM | POA: Diagnosis present

## 2015-01-04 DIAGNOSIS — I4892 Unspecified atrial flutter: Secondary | ICD-10-CM | POA: Diagnosis present

## 2015-01-04 DIAGNOSIS — R001 Bradycardia, unspecified: Secondary | ICD-10-CM

## 2015-01-04 DIAGNOSIS — K449 Diaphragmatic hernia without obstruction or gangrene: Secondary | ICD-10-CM | POA: Diagnosis present

## 2015-01-04 DIAGNOSIS — R059 Cough, unspecified: Secondary | ICD-10-CM

## 2015-01-04 DIAGNOSIS — S7290XA Unspecified fracture of unspecified femur, initial encounter for closed fracture: Secondary | ICD-10-CM | POA: Diagnosis present

## 2015-01-04 DIAGNOSIS — Z419 Encounter for procedure for purposes other than remedying health state, unspecified: Secondary | ICD-10-CM

## 2015-01-04 DIAGNOSIS — Z6820 Body mass index (BMI) 20.0-20.9, adult: Secondary | ICD-10-CM | POA: Diagnosis not present

## 2015-01-04 DIAGNOSIS — E785 Hyperlipidemia, unspecified: Secondary | ICD-10-CM | POA: Diagnosis present

## 2015-01-04 DIAGNOSIS — S72001A Fracture of unspecified part of neck of right femur, initial encounter for closed fracture: Secondary | ICD-10-CM | POA: Diagnosis not present

## 2015-01-04 DIAGNOSIS — R0602 Shortness of breath: Secondary | ICD-10-CM

## 2015-01-04 DIAGNOSIS — H905 Unspecified sensorineural hearing loss: Secondary | ICD-10-CM | POA: Diagnosis present

## 2015-01-04 DIAGNOSIS — S72111A Displaced fracture of greater trochanter of right femur, initial encounter for closed fracture: Principal | ICD-10-CM | POA: Diagnosis present

## 2015-01-04 DIAGNOSIS — Y95 Nosocomial condition: Secondary | ICD-10-CM | POA: Diagnosis not present

## 2015-01-04 DIAGNOSIS — D649 Anemia, unspecified: Secondary | ICD-10-CM | POA: Diagnosis present

## 2015-01-04 DIAGNOSIS — Z8049 Family history of malignant neoplasm of other genital organs: Secondary | ICD-10-CM

## 2015-01-04 DIAGNOSIS — Z7901 Long term (current) use of anticoagulants: Secondary | ICD-10-CM

## 2015-01-04 DIAGNOSIS — Z823 Family history of stroke: Secondary | ICD-10-CM

## 2015-01-04 DIAGNOSIS — J690 Pneumonitis due to inhalation of food and vomit: Secondary | ICD-10-CM | POA: Diagnosis not present

## 2015-01-04 DIAGNOSIS — R05 Cough: Secondary | ICD-10-CM

## 2015-01-04 LAB — CBC WITH DIFFERENTIAL/PLATELET
Basophils Absolute: 0.1 10*3/uL (ref 0–0.1)
Basophils Relative: 2 %
EOS ABS: 0.1 10*3/uL (ref 0–0.7)
Eosinophils Relative: 1 %
HCT: 24.9 % — ABNORMAL LOW (ref 40.0–52.0)
HEMOGLOBIN: 8.1 g/dL — AB (ref 13.0–18.0)
LYMPHS ABS: 0.8 10*3/uL — AB (ref 1.0–3.6)
Lymphocytes Relative: 14 %
MCH: 30 pg (ref 26.0–34.0)
MCHC: 32.4 g/dL (ref 32.0–36.0)
MCV: 92.7 fL (ref 80.0–100.0)
MONO ABS: 0.6 10*3/uL (ref 0.2–1.0)
MONOS PCT: 11 %
NEUTROS PCT: 72 %
Neutro Abs: 4.1 10*3/uL (ref 1.4–6.5)
Platelets: 204 10*3/uL (ref 150–440)
RBC: 2.68 MIL/uL — ABNORMAL LOW (ref 4.40–5.90)
RDW: 12.7 % (ref 11.5–14.5)
WBC: 5.6 10*3/uL (ref 3.8–10.6)

## 2015-01-04 LAB — COMPREHENSIVE METABOLIC PANEL
ALK PHOS: 52 U/L (ref 38–126)
ALT: 13 U/L — ABNORMAL LOW (ref 17–63)
ANION GAP: 0 — AB (ref 5–15)
AST: 19 U/L (ref 15–41)
Albumin: 3.2 g/dL — ABNORMAL LOW (ref 3.5–5.0)
BILIRUBIN TOTAL: 0.4 mg/dL (ref 0.3–1.2)
BUN: 61 mg/dL — ABNORMAL HIGH (ref 6–20)
CALCIUM: 8.2 mg/dL — AB (ref 8.9–10.3)
CO2: 22 mmol/L (ref 22–32)
Chloride: 120 mmol/L — ABNORMAL HIGH (ref 101–111)
Creatinine, Ser: 3.77 mg/dL — ABNORMAL HIGH (ref 0.61–1.24)
GFR calc non Af Amer: 14 mL/min — ABNORMAL LOW (ref >60)
GFR, EST AFRICAN AMERICAN: 16 mL/min — AB (ref >60)
Glucose, Bld: 81 mg/dL (ref 65–99)
Potassium: 5.6 mmol/L — ABNORMAL HIGH (ref 3.5–5.1)
SODIUM: 142 mmol/L (ref 135–145)
TOTAL PROTEIN: 6 g/dL — AB (ref 6.5–8.1)

## 2015-01-04 LAB — TROPONIN I: Troponin I: <0.03 ng/mL (ref <0.031)

## 2015-01-04 LAB — PROTIME-INR
INR: 2.72
INR: 2.01
PROTHROMBIN TIME: 28.9 s — AB (ref 11.4–15.0)
PROTHROMBIN TIME: 22.9 s — AB (ref 11.4–15.0)

## 2015-01-04 LAB — HEMOGLOBIN A1C: Hgb A1c MFr Bld: 5.9 % (ref 4.0–6.0)

## 2015-01-04 LAB — TSH: TSH: 2.194 u[IU]/mL (ref 0.350–4.500)

## 2015-01-04 LAB — SURGICAL PCR SCREEN
MRSA, PCR: NEGATIVE
Staphylococcus aureus: NEGATIVE

## 2015-01-04 MED ORDER — PANTOPRAZOLE SODIUM 40 MG PO TBEC
40.0000 mg | DELAYED_RELEASE_TABLET | Freq: Two times a day (BID) | ORAL | Status: DC
  Administered 2015-01-04 (×2): 40 mg via ORAL
  Filled 2015-01-04 (×2): qty 1

## 2015-01-04 MED ORDER — GALANTAMINE HYDROBROMIDE 4 MG PO TABS
8.0000 mg | ORAL_TABLET | Freq: Two times a day (BID) | ORAL | Status: DC
  Administered 2015-01-04 – 2015-01-10 (×5): 8 mg via ORAL
  Filled 2015-01-04 (×14): qty 2

## 2015-01-04 MED ORDER — DEXTROSE 5 % IV SOLN
10.0000 mg | Freq: Once | INTRAVENOUS | Status: AC
  Administered 2015-01-04: 10 mg via INTRAVENOUS
  Filled 2015-01-04: qty 1

## 2015-01-04 MED ORDER — PAROXETINE HCL 20 MG PO TABS
10.0000 mg | ORAL_TABLET | ORAL | Status: DC
  Filled 2015-01-04: qty 1

## 2015-01-04 MED ORDER — ONDANSETRON HCL 4 MG/2ML IJ SOLN
4.0000 mg | Freq: Once | INTRAMUSCULAR | Status: AC
  Administered 2015-01-04: 4 mg via INTRAVENOUS
  Filled 2015-01-04: qty 2

## 2015-01-04 MED ORDER — MORPHINE SULFATE (PF) 2 MG/ML IV SOLN
1.0000 mg | INTRAVENOUS | Status: DC | PRN
  Administered 2015-01-04 – 2015-01-08 (×12): 1 mg via INTRAVENOUS
  Filled 2015-01-04 (×13): qty 1

## 2015-01-04 MED ORDER — ACETAMINOPHEN 500 MG PO TABS: 1000.0000 mg | ORAL_TABLET | Freq: Four times a day (QID) | ORAL | Status: DC | PRN

## 2015-01-04 MED ORDER — TAMSULOSIN HCL 0.4 MG PO CAPS
0.4000 mg | ORAL_CAPSULE | Freq: Every day | ORAL | Status: DC
  Administered 2015-01-04 – 2015-01-10 (×3): 0.4 mg via ORAL
  Filled 2015-01-04 (×3): qty 1

## 2015-01-04 MED ORDER — LOSARTAN POTASSIUM 50 MG PO TABS
100.0000 mg | ORAL_TABLET | Freq: Every day | ORAL | Status: DC
  Administered 2015-01-04: 100 mg via ORAL
  Filled 2015-01-04: qty 2

## 2015-01-04 MED ORDER — ONDANSETRON HCL 4 MG/2ML IJ SOLN: 4.0000 mg | Freq: Four times a day (QID) | INTRAMUSCULAR | Status: DC | PRN

## 2015-01-04 MED ORDER — WARFARIN SODIUM 3 MG PO TABS: 1.5000 mg | ORAL_TABLET | Freq: Every day | ORAL | Status: DC

## 2015-01-04 MED ORDER — SODIUM CHLORIDE 0.9 % IJ SOLN
3.0000 mL | Freq: Two times a day (BID) | INTRAMUSCULAR | Status: DC
  Administered 2015-01-04 – 2015-01-10 (×5): 3 mL via INTRAVENOUS

## 2015-01-04 MED ORDER — DILTIAZEM HCL ER COATED BEADS 120 MG PO CP24
120.0000 mg | ORAL_CAPSULE | Freq: Two times a day (BID) | ORAL | Status: DC
  Administered 2015-01-04 – 2015-01-10 (×4): 120 mg via ORAL
  Filled 2015-01-04 (×5): qty 1

## 2015-01-04 MED ORDER — WARFARIN SODIUM 1 MG PO TABS: 1.5000 mg | ORAL_TABLET | ORAL | Status: DC

## 2015-01-04 MED ORDER — MORPHINE SULFATE (PF) 2 MG/ML IV SOLN
2.0000 mg | Freq: Once | INTRAVENOUS | Status: AC
  Administered 2015-01-04: 1 mg via INTRAVENOUS
  Filled 2015-01-04: qty 1

## 2015-01-04 MED ORDER — ONDANSETRON HCL 4 MG PO TABS: 4.0000 mg | ORAL_TABLET | Freq: Four times a day (QID) | ORAL | Status: DC | PRN

## 2015-01-04 MED ORDER — DOCUSATE SODIUM 100 MG PO CAPS
100.0000 mg | ORAL_CAPSULE | Freq: Two times a day (BID) | ORAL | Status: DC
  Administered 2015-01-04 (×2): 100 mg via ORAL
  Filled 2015-01-04 (×3): qty 1

## 2015-01-04 MED ORDER — WARFARIN SODIUM 3 MG PO TABS
3.0000 mg | ORAL_TABLET | ORAL | Status: DC
  Filled 2015-01-04: qty 1

## 2015-01-04 MED ORDER — FUROSEMIDE 20 MG PO TABS
20.0000 mg | ORAL_TABLET | Freq: Every day | ORAL | Status: DC
  Administered 2015-01-04: 20 mg via ORAL
  Filled 2015-01-04 (×2): qty 1

## 2015-01-04 MED ORDER — CLONAZEPAM 0.5 MG PO TABS
0.5000 mg | ORAL_TABLET | Freq: Two times a day (BID) | ORAL | Status: DC
  Administered 2015-01-04 – 2015-01-09 (×4): 0.5 mg via ORAL
  Filled 2015-01-04 (×4): qty 1

## 2015-01-04 MED ORDER — VITAMIN K1 10 MG/ML IJ SOLN
5.0000 mg | Freq: Once | INTRAMUSCULAR | Status: DC
  Filled 2015-01-04: qty 0.5

## 2015-01-04 MED ORDER — FINASTERIDE 5 MG PO TABS
5.0000 mg | ORAL_TABLET | Freq: Every day | ORAL | Status: DC
  Administered 2015-01-04 – 2015-01-10 (×3): 5 mg via ORAL
  Filled 2015-01-04 (×3): qty 1

## 2015-01-04 MED ORDER — CITALOPRAM HYDROBROMIDE 20 MG PO TABS: 20.0000 mg | ORAL_TABLET | Freq: Every day | ORAL | Status: DC

## 2015-01-04 MED ORDER — SODIUM CHLORIDE 0.9 % IV SOLN
INTRAVENOUS | Status: DC
  Administered 2015-01-04 – 2015-01-05 (×3): via INTRAVENOUS

## 2015-01-04 NOTE — Progress Notes (Signed)
Subjective:  Patient switched her room 151. His daughter is at the bedside. Patient sitting upright in bed and resting comfortably. He is tolerating by mouth diet. His daughter is helping him a dinner. Patient appears comfortable. There are no acute issues. Patient is being treated with vitamin K IV to help bring down his INR in preparation for surgery.  Objective:   VITALS:   Filed Vitals:   01/04/15 0430 01/04/15 0505 01/04/15 0527 01/04/15 0820  BP: 159/67  146/70 159/61  Pulse: 74  73 79  Temp:   97.7 F (36.5 C) 97.6 F (36.4 C)  TempSrc:   Oral Oral  Resp: 13  18 18   Height:  5\' 6"  (1.676 m)    Weight:  61.871 kg (136 lb 6.4 oz)    SpO2: 99%  97% 96%    PHYSICAL EXAM:  Right lower extremity exam demonstrates: Sensation intact distally Intact pulses distally Dorsiflexion/Plantar flexion intact Compartment soft  LABS  Results for orders placed or performed during the hospital encounter of 01/04/15 (from the past 24 hour(s))  CBC with Differential     Status: Abnormal   Collection Time: 01/04/15  2:35 AM  Result Value Ref Range   WBC 5.6 3.8 - 10.6 K/uL   RBC 2.68 (L) 4.40 - 5.90 MIL/uL   Hemoglobin 8.1 (L) 13.0 - 18.0 g/dL   HCT 24.9 (L) 40.0 - 52.0 %   MCV 92.7 80.0 - 100.0 fL   MCH 30.0 26.0 - 34.0 pg   MCHC 32.4 32.0 - 36.0 g/dL   RDW 12.7 11.5 - 14.5 %   Platelets 204 150 - 440 K/uL   Neutrophils Relative % 72 %   Neutro Abs 4.1 1.4 - 6.5 K/uL   Lymphocytes Relative 14 %   Lymphs Abs 0.8 (L) 1.0 - 3.6 K/uL   Monocytes Relative 11 %   Monocytes Absolute 0.6 0.2 - 1.0 K/uL   Eosinophils Relative 1 %   Eosinophils Absolute 0.1 0 - 0.7 K/uL   Basophils Relative 2 %   Basophils Absolute 0.1 0 - 0.1 K/uL  Comprehensive metabolic panel     Status: Abnormal   Collection Time: 01/04/15  2:35 AM  Result Value Ref Range   Sodium 142 135 - 145 mmol/L   Potassium 5.6 (H) 3.5 - 5.1 mmol/L   Chloride 120 (H) 101 - 111 mmol/L   CO2 22 22 - 32 mmol/L   Glucose,  Bld 81 65 - 99 mg/dL   BUN 61 (H) 6 - 20 mg/dL   Creatinine, Ser 3.77 (H) 0.61 - 1.24 mg/dL   Calcium 8.2 (L) 8.9 - 10.3 mg/dL   Total Protein 6.0 (L) 6.5 - 8.1 g/dL   Albumin 3.2 (L) 3.5 - 5.0 g/dL   AST 19 15 - 41 U/L   ALT 13 (L) 17 - 63 U/L   Alkaline Phosphatase 52 38 - 126 U/L   Total Bilirubin 0.4 0.3 - 1.2 mg/dL   GFR calc non Af Amer 14 (L) >60 mL/min   GFR calc Af Amer 16 (L) >60 mL/min   Anion gap 0 (L) 5 - 15  Troponin I     Status: None   Collection Time: 01/04/15  2:35 AM  Result Value Ref Range   Troponin I <0.03 <0.031 ng/mL  Protime-INR     Status: Abnormal   Collection Time: 01/04/15  2:35 AM  Result Value Ref Range   Prothrombin Time 28.9 (H) 11.4 - 15.0 seconds   INR  2.19   TSH     Status: None   Collection Time: 01/04/15  2:35 AM  Result Value Ref Range   TSH 2.194 0.350 - 4.500 uIU/mL  Hemoglobin A1c     Status: None   Collection Time: 01/04/15  2:35 AM  Result Value Ref Range   Hgb A1c MFr Bld 5.9 4.0 - 6.0 %  Surgical pcr screen     Status: None   Collection Time: 01/04/15 10:58 AM  Result Value Ref Range   MRSA, PCR NEGATIVE NEGATIVE   Staphylococcus aureus NEGATIVE NEGATIVE  Protime-INR     Status: Abnormal   Collection Time: 01/04/15  4:50 PM  Result Value Ref Range   Prothrombin Time 22.9 (H) 11.4 - 15.0 seconds   INR 2.01     Dg Chest 1 View  01/04/2015   CLINICAL DATA:  Right hip and left hip pain. Unwitnessed fall. Patient on Coumadin. Preoperative study.  EXAM: CHEST  1 VIEW  COMPARISON:  11/03/2014  FINDINGS: Postoperative changes in the mediastinum. Heart size and pulmonary vascularity are normal. No focal airspace disease or consolidation. No pneumothorax. Calcified and tortuous aorta.  IMPRESSION: No active disease.   Electronically Signed   By: Lucienne Capers M.D.   On: 01/04/2015 03:12   Ct Head Wo Contrast  01/04/2015   CLINICAL DATA:  Fall with loss of consciousness. Slip on hardwood floor. On Coumadin with history of dementia.   EXAM: CT HEAD WITHOUT CONTRAST  TECHNIQUE: Contiguous axial images were obtained from the base of the skull through the vertex without intravenous contrast.  COMPARISON:  11/21/2014  FINDINGS: Unchanged cerebral atrophy. Unchanged chronic small vessel ischemic change.No intracranial hemorrhage, mass effect, or midline shift. No hydrocephalus. The basilar cisterns are patent. No evidence of territorial infarct. No intracranial fluid collection. Calvarium is intact. Included paranasal sinuses and mastoid air cells are well aerated.  IMPRESSION: Stable atrophy and chronic small vessel ischemic change without acute intracranial abnormality.   Electronically Signed   By: Jeb Levering M.D.   On: 01/04/2015 02:55   Dg Hip Unilat With Pelvis 2-3 Views Right  01/04/2015   CLINICAL DATA:  Right-sided hip pain after slip and fall.  EXAM: DG HIP (WITH OR WITHOUT PELVIS) 2-3V RIGHT  COMPARISON:  None.  FINDINGS: Primarily oblique fracture of the right proximal femur with intertrochanteric and subtrochanteric components. Minimal displacement. No significant angulation. Remainder the bony pelvis and left hip are intact. There are dense atherosclerotic calcifications of the pelvic vasculature. Tacks from bilateral hernia repairs noted.  IMPRESSION: Right proximal femur fracture with mild displacement involving the intertrochanteric and subtrochanteric femur.   Electronically Signed   By: Jeb Levering M.D.   On: 01/04/2015 03:13    Assessment/Plan:     Active Problems:   Femur fracture   Patient's INR was repeated this afternoon and is now 2.01. An additional 10 mg IV of vitamin K will be given this evening. He will have his INR redrawn early tomorrow morning to determine if he is a candidate for surgery. Surgery is tentatively scheduled for tomorrow morning at 7:30 AM. He'll remain nothing by mouth after midnight tonight. Patient's daughter understood and agreed with this plan.   Thornton Park ,  MD 01/04/2015, 5:44 PM

## 2015-01-04 NOTE — Consult Note (Addendum)
ORTHOPAEDIC CONSULTATION  REQUESTING PHYSICIAN: [name], MD  Chief Complaint: Right hip pain status post fall  HPI: Derek [name] is a 81 y.o. male who complains of  right hip. After falling out of bed last. He is brought to the [facility] Emergency Room where he was diagnosed with a right intratrochanteric hip fracture. He was admitted to the hospitalist service for episode of bradycardia and preoperative evaluation. Orthopedics is consulted for management of his hip fracture.  Past Medical History  Diagnosis Date  . Undiagnosed cardiac murmurs   . CAD (coronary artery disease)   . Other and unspecified hyperlipidemia   . Unspecified essential hypertension   . Diaphragmatic hernia without mention of obstruction or gangrene   . Sensory hearing loss, unilateral   . Colon polyps   . Impaired fasting glucose   . Esophageal reflux   . Elevated prostate specific antigen (PSA)   . Unspecified disorder resulting from impaired renal function   . Unspecified disorder of kidney and ureter   . Alzheimer's dementia    Past Surgical History  Procedure Laterality Date  . Polyp removal  1991  . Hop r/o  1994    HTN past mild MI Dr. Gwenlyn Found  . Stress cardiolite  4/99    nml. Dr. Percival Spanish. no change when done 02/06/03  . Colonoscopy  10/99    no polyps. Dr. Vira Agar  . Esophagogastroduodenoscopy  08/15/02    hh erosions of stomah. Dr. Vira Agar  . Hernia repair  05/11/03  . Esophagogastroduodenoscopy  08/15/02    hh erosive gastropathy- duodenal mass   . Cystoscopy  5/07     thickening of bladder wall; other wise wnl   Social History   Social History  . Marital Status: Widowed    Spouse Name: N/A  . Number of Children: N/A  . Years of Education: N/A   Social History Main Topics  . Smoking status: Former Research scientist (life sciences)  . Smokeless tobacco: None     Comment: quit in 1982  . Alcohol Use: No  . Drug Use: No  . Sexual Activity: Not Asked   Other Topics Concern  . None   Social  History Narrative   Married, lives with wife. 2 daughters-out of home- 1 ER nurse at [employer]; 1 at [employer].    Retired (10/96)- [employer]    Family History  Problem Relation Age of Onset  . Cancer      aunt x 2. breast/ovarian/uterine cacner  . Depression Neg Hx   . Drug abuse Neg Hx     no alcohol abuse  . Stroke      grandfather   Allergies  Allergen Reactions  . Pradaxa [Dabigatran Etexilate Mesylate] Other (See Comments)   Prior to Admission medications   Medication Sig Start Date End Date Taking? Authorizing Provider  acetaminophen (TYLENOL) 500 MG tablet Take 1,000 mg by mouth every 6 (six) hours as needed for mild pain or headache.   Yes Historical Provider, MD  clonazePAM (KLONOPIN) 0.5 MG tablet Take 1 tablet by mouth 2 (two) times daily. 12/26/14 01/25/15 Yes Historical Provider, MD  diltiazem (CARDIZEM CD) 120 MG 24 hr capsule Take 120 mg by mouth 2 (two) times daily.   Yes Historical Provider, MD  finasteride (PROSCAR) 5 MG tablet Take 5 mg by mouth daily.   Yes Historical Provider, MD  furosemide (LASIX) 20 MG tablet Take 20 mg by mouth daily.   Yes Historical Provider, MD  galantamine (  RAZADYNE) 8 MG tablet Take 8 mg by mouth 2 (two) times daily with a meal.   Yes Historical Provider, MD  losartan (COZAAR) 100 MG tablet Take 100 mg by mouth daily.   Yes Historical Provider, MD  pantoprazole (PROTONIX) 40 MG tablet Take 40 mg by mouth 2 (two) times daily.   Yes Historical Provider, MD  tamsulosin (FLOMAX) 0.4 MG CAPS capsule Take 0.4 mg by mouth daily. 03/26/12  Yes Historical Provider, MD  warfarin (COUMADIN) 3 MG tablet Take 1.5-3 mg by mouth at bedtime. 0.5 tablet on Monday, Wednesday, and Friday; 1 tablet on Tuesday, Thursday, Saturday, and Sunday   Yes Historical Provider, MD  citalopram (CELEXA) 20 MG tablet Take 1 tablet by mouth daily. 12/24/14   Historical Provider, MD  PARoxetine (PAXIL) 10 MG tablet Take 10 mg by mouth every morning.     Historical Provider, MD   Dg Chest 1 View  01/04/2015   CLINICAL DATA:  Right hip and left hip pain. Unwitnessed fall. Patient on Coumadin. Preoperative study.  EXAM: CHEST  1 VIEW  COMPARISON:  11/03/2014  FINDINGS: Postoperative changes in the mediastinum. Heart size and pulmonary vascularity are normal. No focal airspace disease or consolidation. No pneumothorax. Calcified and tortuous aorta.  IMPRESSION: No active disease.   Electronically Signed   By: Lucienne Capers M.D.   On: 01/04/2015 03:12   Ct Head Wo Contrast  01/04/2015   CLINICAL DATA:  Fall with loss of consciousness. Slip on hardwood floor. On Coumadin with history of dementia.  EXAM: CT HEAD WITHOUT CONTRAST  TECHNIQUE: Contiguous axial images were obtained from the base of the skull through the vertex without intravenous contrast.  COMPARISON:  11/21/2014  FINDINGS: Unchanged cerebral atrophy. Unchanged chronic small vessel ischemic change.No intracranial hemorrhage, mass effect, or midline shift. No hydrocephalus. The basilar cisterns are patent. No evidence of territorial infarct. No intracranial fluid collection. Calvarium is intact. Included paranasal sinuses and mastoid air cells are well aerated.  IMPRESSION: Stable atrophy and chronic small vessel ischemic change without acute intracranial abnormality.   Electronically Signed   By: Jeb Levering M.D.   On: 01/04/2015 02:55   Dg Hip Unilat With Pelvis 2-3 Views Right  01/04/2015   CLINICAL DATA:  Right-sided hip pain after slip and fall.  EXAM: DG HIP (WITH OR WITHOUT PELVIS) 2-3V RIGHT  COMPARISON:  None.  FINDINGS: Primarily oblique fracture of the right proximal femur with intertrochanteric and subtrochanteric components. Minimal displacement. No significant angulation. Remainder the bony pelvis and left hip are intact. There are dense atherosclerotic calcifications of the pelvic vasculature. Tacks from bilateral hernia repairs noted.  IMPRESSION: Right proximal femur fracture  with mild displacement involving the intertrochanteric and subtrochanteric femur.   Electronically Signed   By: Jeb Levering M.D.   On: 01/04/2015 03:13    Positive ROS: All other systems have been reviewed and were otherwise negative with the exception of those mentioned in the HPI and as above.  Physical Exam: General: Alert, no acute distress. Patient hard of hearing.   MUSCULOSKELETAL: Patient's skin is intact. Thigh compartments are soft and compressible. He has palpable pedal pulses. He has intact sensation to light touch and intact motor function the right lower extremity. He is slightly externally rotated with shortening.  Assessment: Right intertrochanteric hip fracture  Plan: I spoke with the patient and her daughter this morning. I explained to them about the intertrochanteric hip fracture. I have recommended intramedullary fixation. The patient's INR is 2.7. He  is on Coumadin for atrial fibrillation. Dr. Emily Filbert has seen the patient and felt he to proceed with surgery once he was using a subtotal INR range. Patient has chronic kidney disease with a baseline creatinine of 3.7. He also has associated anemia at baseline. Patient is ordered for 10 mg of IV vitamin K today. Possible surgery tomorrow. Patient will remain nothing by mouth after midnight tonight and stat labs will be redrawn in the morning to check his INR.   Thornton Park, MD    01/04/2015 12:12 PM

## 2015-01-04 NOTE — Clinical Documentation Improvement (Signed)
Internal Medicine  Can the diagnosis of CKD be further specified?   CKD Stage I - GFR greater than or equal to 90  CKD Stage II - GFR 60-89  CKD Stage III - GFR 30-59  CKD Stage IV - GFR 15-29  CKD Stage V - GFR < 15  ESRD (End Stage Renal Disease)  Other condition  Unable to clinically determine   Supporting Information:   Component     Latest Ref Rng 01/04/2015         2:35 AM  BUN     6 - 20 mg/dL 61 (H)  Creatinine     0.61 - 1.24 mg/dL 3.77 (H)  Sodium     135 - 145 mmol/L 142  Potassium     3.5 - 5.1 mmol/L 5.6 (H)  Chloride     101 - 111 mmol/L 120 (H)  CO2     22 - 32 mmol/L 22  Calcium     8.9 - 10.3 mg/dL 8.2 (L)  Albumin     3.5 - 5.0 g/dL 3.2 (L)  Total Protein     6.5 - 8.1 g/dL 6.0 (L)  Total Bilirubin     0.3 - 1.2 mg/dL 0.4  Anion gap     5 - 15 0 (L)  EGFR (African American)     >60 mL/min 16 (L)  EGFR (Non-African Amer.)     >60 mL/min 14 (L)    Please exercise your independent, professional judgment when responding. A specific answer is not anticipated or expected.  Thank You, Alessandra Grout, RN, BSN, CCDS,Clinical Documentation Specialist:  [phone]  [phone]=Cell [place]

## 2015-01-04 NOTE — ED Notes (Signed)
Admitting MD at bedside.

## 2015-01-04 NOTE — Progress Notes (Signed)
Dr.Krasinski here to speak with daughter  and patient- discussing plan for surgery.

## 2015-01-04 NOTE — ED Provider Notes (Signed)
[facility] Emergency Department Provider Note  ____________________________________________  Time seen: 2:20 AM  I have reviewed the triage vital signs and the nursing notes.   HISTORY  Chief Complaint Fall and Hip Pain      HPI Derek [name] is a 81 y.o. male presents via EMS with history of accidental slip and fall at home tonight. Per EMS patient slipped on a hardwood floor complaining of right hip pain. Patient is admits to right hip pain. Patient unsure if he struck his head or lost consciousness. Of note patient has a history of dementia.    Past Medical History  Diagnosis Date  . Undiagnosed cardiac murmurs   . CAD (coronary artery disease)   . Other and unspecified hyperlipidemia   . Unspecified essential hypertension   . Diaphragmatic hernia without mention of obstruction or gangrene   . Sensory hearing loss, unilateral   . Colon polyps   . Impaired fasting glucose   . Esophageal reflux   . Elevated prostate specific antigen (PSA)   . Unspecified disorder resulting from impaired renal function   . Unspecified disorder of kidney and ureter   . Alzheimer's dementia     Patient Active Problem List   Diagnosis Date Noted  . Dehydration 11/03/2014  . MURMUR 10/19/2008  . COLONIC POLYPS 09/29/2006  . HYPERLIPIDEMIA 09/29/2006  . LOSS, SENSORINEURAL HEAR, UNILATERAL 09/29/2006  . HYPERTENSION 09/29/2006  . CAD 09/29/2006  . GERD 09/29/2006  . HIATAL HERNIA 09/29/2006  . Chronic kidney disease, stage III (moderate) 09/29/2006  . IMPAIRED FASTING GLUCOSE 09/29/2006  . ELEVATED PROSTATE SPECIFIC ANTIGEN 09/29/2006    Past Surgical History  Procedure Laterality Date  . Polyp removal  1991  . Hop r/o  1994    HTN past mild MI Dr. Gwenlyn Found  . Stress cardiolite  4/99    nml. Dr. Percival Spanish. no change when done 02/06/03  . Colonoscopy  10/99    no polyps. Dr. Vira Agar  . Esophagogastroduodenoscopy  08/15/02    hh erosions of stomah. Dr.  Vira Agar  . Hernia repair  05/11/03  . Esophagogastroduodenoscopy  08/15/02    hh erosive gastropathy- duodenal mass   . Cystoscopy  5/07     thickening of bladder wall; other wise wnl    Current Outpatient Rx  Name  Route  Sig  Dispense  Refill  . acetaminophen (TYLENOL) 500 MG tablet   Oral   Take 1,000 mg by mouth every 6 (six) hours as needed for mild pain or headache.         . diltiazem (CARDIZEM CD) 120 MG 24 hr capsule   Oral   Take 120 mg by mouth 2 (two) times daily.         . finasteride (PROSCAR) 5 MG tablet   Oral   Take 5 mg by mouth daily.         . furosemide (LASIX) 20 MG tablet   Oral   Take 20 mg by mouth daily.         Marland Kitchen galantamine (RAZADYNE) 8 MG tablet   Oral   Take 8 mg by mouth 2 (two) times daily with a meal.         . losartan (COZAAR) 100 MG tablet   Oral   Take 100 mg by mouth daily.         . pantoprazole (PROTONIX) 40 MG tablet   Oral   Take 40 mg by mouth 2 (two) times daily.         Marland Kitchen  PARoxetine (PAXIL) 10 MG tablet   Oral   Take 10 mg by mouth every morning.         . warfarin (COUMADIN) 3 MG tablet   Oral   Take 1.5-3 mg by mouth at bedtime. 0.5 tablet on Monday, Wednesday, and Friday; 1 tablet on Tuesday, Thursday, Saturday, and Sunday           Allergies Pradaxa  Family History  Problem Relation Age of Onset  . Cancer      aunt x 2. breast/ovarian/uterine cacner  . Depression Neg Hx   . Drug abuse Neg Hx     no alcohol abuse  . Stroke      grandfather    Social History Social History  Substance Use Topics  . Smoking status: Former Research scientist (life sciences)  . Smokeless tobacco: None     Comment: quit in 1982  . Alcohol Use: No    Review of Systems  Constitutional: Negative for fever. Eyes: Negative for visual changes. ENT: Negative for sore throat. Cardiovascular: Negative for chest pain. Respiratory: Negative for shortness of breath. Gastrointestinal: Negative for abdominal pain, vomiting and  diarrhea. Genitourinary: Negative for dysuria. Musculoskeletal: Negative for back pain. Positive for right hip pain Skin: Negative for rash. Neurological: Negative for headaches, focal weakness or numbness.   10-point ROS otherwise negative.  ____________________________________________   PHYSICAL EXAM:  VITAL SIGNS: ED Triage Vitals  Enc Vitals Group     BP 01/04/15 0228 171/67 mmHg     Pulse Rate 01/04/15 0228 68     Resp 01/04/15 0228 16     Temp 01/04/15 0230 97.6 F (36.4 C)     Temp Source 01/04/15 0230 Oral     SpO2 01/04/15 0228 98 %     Weight 01/04/15 0228 126 lb 1.6 oz (57.199 kg)     Height 01/04/15 0228 5\' 11"  (1.803 m)     Head Cir --      Peak Flow --      Pain Score 01/04/15 0227 2     Pain Loc --      Pain Edu? --      Excl. in [place]? --      Constitutional: Alert and oriented. Well appearing and in no distress. Eyes: Conjunctivae are normal. PERRL. Normal extraocular movements. ENT   Head: Normocephalic and atraumatic.   Nose: No congestion/rhinnorhea.   Mouth/Throat: Mucous membranes are moist.   Neck: No stridor. Hematological/Lymphatic/Immunilogical: No cervical lymphadenopathy. Cardiovascular: Normal rate, regular rhythm. Normal and symmetric distal pulses are present in all extremities. Grade 3 systolic ejection murmur Respiratory: Normal respiratory effort without tachypnea nor retractions. Breath sounds are clear and equal bilaterally. No wheezes/rales/rhonchi. Gastrointestinal: Soft and nontender. No distention. There is no CVA tenderness. Genitourinary: deferred Musculoskeletal: Right hip tenderness to palpation, right leg externally rotated. Neurologic:  Normal speech and language. No gross focal neurologic deficits are appreciated. Speech is normal.  Skin:  Skin is warm, dry and intact. No rash noted. Psychiatric: Mood and affect are normal. Speech and behavior are normal. Patient exhibits appropriate insight and  judgment.  ____________________________________________    LABS (pertinent positives/negatives)  Labs Reviewed  CBC WITH DIFFERENTIAL/PLATELET - Abnormal; Notable for the following:    RBC 2.68 (*)    Hemoglobin 8.1 (*)    HCT 24.9 (*)    Lymphs Abs 0.8 (*)    All other components within normal limits  PROTIME-INR - Abnormal; Notable for the following:    Prothrombin Time 28.9 (*)  All other components within normal limits  COMPREHENSIVE METABOLIC PANEL  TROPONIN I     ____________________________________________   EKG  ED ECG REPORT I, Jamerica Snavely, [place], the attending physician, personally viewed and interpreted this ECG.   Date: 01/04/2015  EKG Time: 2:27 AM  Rate: 67  Rhythm: Normal sinus rhythm  Axis: None  Intervals: Normal  ST&T Change: None   ____________________________________________    RADIOLOGY     DG Chest 1 View (Final result) Result time: 01/04/15 03:12:10   Final result by Rad Results In Interface (01/04/15 03:12:10)   Narrative:   CLINICAL DATA: Right hip and left hip pain. Unwitnessed fall. Patient on Coumadin. Preoperative study.  EXAM: CHEST 1 VIEW  COMPARISON: 11/03/2014  FINDINGS: Postoperative changes in the mediastinum. Heart size and pulmonary vascularity are normal. No focal airspace disease or consolidation. No pneumothorax. Calcified and tortuous aorta.  IMPRESSION: No active disease.   Electronically Signed By: Lucienne Capers M.D. On: 01/04/2015 03:12          DG HIP UNILAT WITH PELVIS 2-3 VIEWS RIGHT (Final result) Result time: 01/04/15 03:13:03   Final result by Rad Results In Interface (01/04/15 03:13:03)   Narrative:   CLINICAL DATA: Right-sided hip pain after slip and fall.  EXAM: DG HIP (WITH OR WITHOUT PELVIS) 2-3V RIGHT  COMPARISON: None.  FINDINGS: Primarily oblique fracture of the right proximal femur with intertrochanteric and subtrochanteric components.  Minimal displacement. No significant angulation. Remainder the bony pelvis and left hip are intact. There are dense atherosclerotic calcifications of the pelvic vasculature. Tacks from bilateral hernia repairs noted.  IMPRESSION: Right proximal femur fracture with mild displacement involving the intertrochanteric and subtrochanteric femur.   Electronically Signed By: Jeb Levering M.D. On: 01/04/2015 03:13          CT Head Wo Contrast (Final result) Result time: 01/04/15 02:55:47   Final result by Rad Results In Interface (01/04/15 02:55:47)   Narrative:   CLINICAL DATA: Fall with loss of consciousness. Slip on hardwood floor. On Coumadin with history of dementia.  EXAM: CT HEAD WITHOUT CONTRAST  TECHNIQUE: Contiguous axial images were obtained from the base of the skull through the vertex without intravenous contrast.  COMPARISON: 11/21/2014  FINDINGS: Unchanged cerebral atrophy. Unchanged chronic small vessel ischemic change.No intracranial hemorrhage, mass effect, or midline shift. No hydrocephalus. The basilar cisterns are patent. No evidence of territorial infarct. No intracranial fluid collection. Calvarium is intact. Included paranasal sinuses and mastoid air cells are well aerated.  IMPRESSION: Stable atrophy and chronic small vessel ischemic change without acute intracranial abnormality.   Electronically Signed By: Jeb Levering M.D. On: 01/04/2015 02:55          INITIAL IMPRESSION / ASSESSMENT AND PLAN / ED COURSE  Pertinent labs & imaging results that were available during my care of the patient were reviewed by me and considered in my medical decision making (see chart for details).  Patient received IV morphine 2 mg and Zofran for analgesia and antiemetic. History physical exam and x-ray findings consistent with right intertrochanteric fracture of the right hip. Patient discussed with Dr. Marcille Blanco for hospital  admission. ----------------------------------------- 3:27 AM on 01/04/2015 -----------------------------------------  Patient noted to be periodically bradycardiac with a heart rate of 33 which then quickly returns to 71. This occurred multiple times while patient was in the emergency department treatment room. I spoke with the patient's daughter who stated that he has a history of atrial fibrillation and was told in the past that he might need  a pacemaker but that was never performed. Patient remained normotensive during these bradycardic episodes. This clinical finding was also conveyed to Dr. Marcille Blanco. ____________________________________________   FINAL CLINICAL IMPRESSION(S) / ED DIAGNOSES  Final diagnoses:  Closed right hip fracture, initial encounter  Sinus bradycardia      Gregor Hams, MD 01/04/15 [phone]

## 2015-01-04 NOTE — Care Management Note (Signed)
Case Management Note  Patient Details  Name: CAIN FITZHENRY MRN: [id number] Date of Birth: 06-Nov-1930  Subjective/Objective:                  Met briefly with patient's daughter which was on the phone at the time this RNCM rounded. Surgery decision pending ortho consult. Patient apparently lives with this daughter. Patient was alert but unsure of orientation since my visit was short.   Action/Plan: RNCM to continue to follow along with CSW. List of home health agencies left with patient's daughter.  Expected Discharge Date:  01/08/15               Expected Discharge Plan:     In-House Referral:     Discharge planning Services  CM Consult  Post Acute Care Choice:  Home Health, Durable Medical Equipment Choice offered to:  Adult Children  DME Arranged:    DME Agency:     HH Arranged:    HH Agency:     Status of Service:  In process, will continue to follow  Medicare Important Message Given:    Date Medicare IM Given:    Medicare IM give by:    Date Additional Medicare IM Given:    Additional Medicare Important Message give by:     If discussed at [place] of Stay Meetings, dates discussed:    Additional Comments:  Marshell Garfinkel, RN 01/04/2015, 10:55 AM

## 2015-01-04 NOTE — Clinical Social Work Placement (Signed)
   CLINICAL SOCIAL WORK PLACEMENT  NOTE  Date:  01/04/2015  Patient Details  Name: Derek [name] MRN: [id number] Date of Birth: [birth year]  Clinical Social Work is seeking post-discharge placement for this patient at the [place] level of care (*CSW will initial, date and re-position this form in  chart as items are completed):  Yes   Patient/family provided with [place] Work Department's list of facilities offering this level of care within the geographic area requested by the patient (or if unable, by the patient's family).  Yes   Patient/family informed of their freedom to choose among providers that offer the needed level of care, that participate in Medicare, Medicaid or managed care program needed by the patient, have an available bed and are willing to accept the patient.  Yes   Patient/family informed of [place]'s ownership interest in [facility] and [facility], as well as of the fact that they are under no obligation to receive care at these facilities.  PASRR submitted to EDS on       PASRR number received on       Existing PASRR number confirmed on 01/04/15     FL2 transmitted to all facilities in geographic area requested by pt/family on 01/04/15     FL2 transmitted to all facilities within larger geographic area on       Patient informed that his/her managed care company has contracts with or will negotiate with certain facilities, including the following:            Patient/family informed of bed offers received.  Patient chooses bed at       Physician recommends and patient chooses bed at      Patient to be transferred to   on  .  Patient to be transferred to facility by       Patient family notified on   of transfer.  Name of family member notified:        PHYSICIAN Please sign FL2     Additional Comment:    _______________________________________________ Loralyn Freshwater, LCSW 01/04/2015, 5:19 PM

## 2015-01-04 NOTE — Progress Notes (Signed)
Derek [name] is a 80 y.o. male   SUBJECTIVE:  Patient suffered a fall last night, fracturing his left proximal femur. He notes some pain. No other focal complaints. Did have bradycardia transiently in the ER, now resolved, no chest discomfort.  ______________________________________________________________________  ROS: Review of systems is unremarkable for any active cardiac,respiratory, GI, GU, hematologic, neurologic or psychiatric systems, 10 systems reviewed.  . citalopram  20 mg Oral Daily  . clonazePAM  0.5 mg Oral BID  . diltiazem  120 mg Oral BID  . docusate sodium  100 mg Oral BID  . finasteride  5 mg Oral Daily  . furosemide  20 mg Oral Daily  . galantamine  8 mg Oral BID WC  . losartan  100 mg Oral Daily  . pantoprazole  40 mg Oral BID  . PARoxetine  10 mg Oral [id number]  . sodium chloride  3 mL Intravenous Q12H  . tamsulosin  0.4 mg Oral Daily   acetaminophen, morphine injection, ondansetron **OR** ondansetron (ZOFRAN) IV   Past Medical History  Diagnosis Date  . Undiagnosed cardiac murmurs   . CAD (coronary artery disease)   . Other and unspecified hyperlipidemia   . Unspecified essential hypertension   . Diaphragmatic hernia without mention of obstruction or gangrene   . Sensory hearing loss, unilateral   . Colon polyps   . Impaired fasting glucose   . Esophageal reflux   . Elevated prostate specific antigen (PSA)   . Unspecified disorder resulting from impaired renal function   . Unspecified disorder of kidney and ureter   . Alzheimer's dementia     Past Surgical History  Procedure Laterality Date  . Polyp removal  1991  . Hop r/o  1994    HTN past mild MI Dr. Gwenlyn Found  . Stress cardiolite  4/99    nml. Dr. Percival Spanish. no change when done 02/06/03  . Colonoscopy  10/99    no polyps. Dr. Vira Agar  . Esophagogastroduodenoscopy  08/15/02    hh erosions of stomah. Dr. Vira Agar  . Hernia repair  05/11/03  . Esophagogastroduodenoscopy  08/15/02    hh erosive  gastropathy- duodenal mass   . Cystoscopy  5/07     thickening of bladder wall; other wise wnl    PHYSICAL EXAM:  BP 146/70 mmHg  Pulse 73  Temp(Src) 97.7 F (36.5 C) (Oral)  Resp 18  Ht 5\' 6"  (1.676 m)  Wt 61.871 kg (136 lb 6.4 oz)  BMI 22.03 kg/m2  SpO2 97%  Wt Readings from Last 3 Encounters:  01/04/15 61.871 kg (136 lb 6.4 oz)  11/21/14 65.772 kg (145 lb)  11/03/14 58.968 kg (130 lb)           BP Readings from Last 3 Encounters:  01/04/15 146/70  11/22/14 185/99  11/04/14 166/67    Constitutional: NAD Neck: supple, no thyromegaly Respiratory: CTA, no rales or wheezes Cardiovascular: IRR, no murmur, no gallop Abdomen: soft, good BS, nontender Extremities: no edema, moderate right anterior tenderness Neuro: alert but not oriented, no focal motor or sensory deficits  ASSESSMENT/PLAN:  Labs and imaging studies were reviewed  Femoral fracture-orthopedic evaluation as to surgery or not, holding Coumadin, can give vitamin K if needed Chronic A. fib-holding Coumadin, rate controlled, no sign of significant bradycardia Progressive aphasia-associated dementia associated with delirium, galantamine and twice a day Klonopin CKD4-baseline creatinine 3.7 with associated anemia Overall poor prognosis, will need skilled nursing

## 2015-01-04 NOTE — ED Notes (Signed)
Patient transported to X-ray 

## 2015-01-04 NOTE — ED Notes (Signed)
Pt arrives via EMS from home.  Pt up to use bathroom.  Pt slipped on hardwood floor d/t socks.  Pt with R hip pain and L rib pain.  Pt denies LOC but fall was unwitnessed.  Pt is on Coumadin and with hx of dementia.

## 2015-01-04 NOTE — H&P (Signed)
Derek [name] is an 81 y.o. male.   Chief Complaint: Hip pain HPI: The patient presents emergency department via EMS after being found on the floor of his bedroom. Apparently the patient tried to stand after awakening from sleep. The patient's daughter with whom he lives heard his dog barking which in her experience usually indicates that he is trying to get up from bed. She immediately went to his room and found him on the floor. The patient was conscious and only in mild distress from the pain in his hip. In the emergency department x-ray revealed a proximal mildly displaced right femur fracture as well as intermittent periods of bradycardia which prompted the emergency department staff to call for admission.  Past Medical History  Diagnosis Date  . Undiagnosed cardiac murmurs   . CAD (coronary artery disease)   . Other and unspecified hyperlipidemia   . Unspecified essential hypertension   . Diaphragmatic hernia without mention of obstruction or gangrene   . Sensory hearing loss, unilateral   . Colon polyps   . Impaired fasting glucose   . Esophageal reflux   . Elevated prostate specific antigen (PSA)   . Unspecified disorder resulting from impaired renal function   . Unspecified disorder of kidney and ureter   . Alzheimer's dementia     Past Surgical History  Procedure Laterality Date  . Polyp removal  1991  . Hop r/o  1994    HTN past mild MI Dr. Gwenlyn Found  . Stress cardiolite  4/99    nml. Dr. Percival Spanish. no change when done 02/06/03  . Colonoscopy  10/99    no polyps. Dr. Vira Agar  . Esophagogastroduodenoscopy  08/15/02    hh erosions of stomah. Dr. Vira Agar  . Hernia repair  05/11/03  . Esophagogastroduodenoscopy  08/15/02    hh erosive gastropathy- duodenal mass   . Cystoscopy  5/07     thickening of bladder wall; other wise wnl    Family History  Problem Relation Age of Onset  . Cancer      aunt x 2. breast/ovarian/uterine cacner  . Depression Neg Hx   . Drug abuse Neg Hx      no alcohol abuse  . Stroke      grandfather   Social History:  reports that he has quit smoking. He does not have any smokeless tobacco history on file. He reports that he does not drink alcohol or use illicit drugs.  Allergies:  Allergies  Allergen Reactions  . Pradaxa [Dabigatran Etexilate Mesylate] Other (See Comments)    Prior to Admission medications   Medication Sig Start Date End Date Taking? Authorizing Provider  acetaminophen (TYLENOL) 500 MG tablet Take 1,000 mg by mouth every 6 (six) hours as needed for mild pain or headache.   Yes Historical Provider, MD  citalopram (CELEXA) 20 MG tablet Take 1 tablet by mouth daily. 12/24/14  Yes Historical Provider, MD  clonazePAM (KLONOPIN) 0.5 MG tablet Take 1 tablet by mouth 2 (two) times daily. 12/26/14 01/25/15 Yes Historical Provider, MD  diltiazem (CARDIZEM CD) 120 MG 24 hr capsule Take 120 mg by mouth 2 (two) times daily.   Yes Historical Provider, MD  finasteride (PROSCAR) 5 MG tablet Take 5 mg by mouth daily.   Yes Historical Provider, MD  furosemide (LASIX) 20 MG tablet Take 20 mg by mouth daily.   Yes Historical Provider, MD  galantamine (RAZADYNE) 8 MG tablet Take 8 mg by mouth 2 (two) times daily with a meal.  Yes Historical Provider, MD  losartan (COZAAR) 100 MG tablet Take 100 mg by mouth daily.   Yes Historical Provider, MD  pantoprazole (PROTONIX) 40 MG tablet Take 40 mg by mouth 2 (two) times daily.   Yes Historical Provider, MD  PARoxetine (PAXIL) 10 MG tablet Take 10 mg by mouth every morning.   Yes Historical Provider, MD  tamsulosin (FLOMAX) 0.4 MG CAPS capsule Take 0.4 mg by mouth daily. 03/26/12  Yes Historical Provider, MD  warfarin (COUMADIN) 3 MG tablet Take 1.5-3 mg by mouth at bedtime. 0.5 tablet on Monday, Wednesday, and Friday; 1 tablet on Tuesday, Thursday, Saturday, and Sunday   Yes Historical Provider, MD     Results for orders placed or performed during the hospital encounter of 01/04/15 (from the past 48  hour(s))  CBC with Differential     Status: Abnormal   Collection Time: 01/04/15  2:35 AM  Result Value Ref Range   WBC 5.6 3.8 - 10.6 K/uL   RBC 2.68 (L) 4.40 - 5.90 MIL/uL   Hemoglobin 8.1 (L) 13.0 - 18.0 g/dL   HCT 24.9 (L) 40.0 - 52.0 %   MCV 92.7 80.0 - 100.0 fL   MCH 30.0 26.0 - 34.0 pg   MCHC 32.4 32.0 - 36.0 g/dL   RDW 12.7 11.5 - 14.5 %   Platelets 204 150 - 440 K/uL   Neutrophils Relative % 72 %   Neutro Abs 4.1 1.4 - 6.5 K/uL   Lymphocytes Relative 14 %   Lymphs Abs 0.8 (L) 1.0 - 3.6 K/uL   Monocytes Relative 11 %   Monocytes Absolute 0.6 0.2 - 1.0 K/uL   Eosinophils Relative 1 %   Eosinophils Absolute 0.1 0 - 0.7 K/uL   Basophils Relative 2 %   Basophils Absolute 0.1 0 - 0.1 K/uL  Comprehensive metabolic panel     Status: Abnormal   Collection Time: 01/04/15  2:35 AM  Result Value Ref Range   Sodium 142 135 - 145 mmol/L   Potassium 5.6 (H) 3.5 - 5.1 mmol/L   Chloride 120 (H) 101 - 111 mmol/L   CO2 22 22 - 32 mmol/L   Glucose, Bld 81 65 - 99 mg/dL   BUN 61 (H) 6 - 20 mg/dL   Creatinine, Ser 3.77 (H) 0.61 - 1.24 mg/dL   Calcium 8.2 (L) 8.9 - 10.3 mg/dL   Total Protein 6.0 (L) 6.5 - 8.1 g/dL   Albumin 3.2 (L) 3.5 - 5.0 g/dL   AST 19 15 - 41 U/L   ALT 13 (L) 17 - 63 U/L   Alkaline Phosphatase 52 38 - 126 U/L   Total Bilirubin 0.4 0.3 - 1.2 mg/dL   GFR calc non Af Amer 14 (L) >60 mL/min   GFR calc Af Amer 16 (L) >60 mL/min    Comment: (NOTE) The eGFR has been calculated using the CKD EPI equation. This calculation has not been validated in all clinical situations. eGFR's persistently <60 mL/min signify possible Chronic Kidney Disease.    Anion gap 0 (L) 5 - 15  Troponin I     Status: None   Collection Time: 01/04/15  2:35 AM  Result Value Ref Range   Troponin I <0.03 <0.031 ng/mL    Comment:        NO INDICATION OF MYOCARDIAL INJURY.   Protime-INR     Status: Abnormal   Collection Time: 01/04/15  2:35 AM  Result Value Ref Range   Prothrombin Time  28.9 (H) 11.4 -  15.0 seconds   INR 2.72    Dg Chest 1 View  01/04/2015   CLINICAL DATA:  Right hip and left hip pain. Unwitnessed fall. Patient on Coumadin. Preoperative study.  EXAM: CHEST  1 VIEW  COMPARISON:  11/03/2014  FINDINGS: Postoperative changes in the mediastinum. Heart size and pulmonary vascularity are normal. No focal airspace disease or consolidation. No pneumothorax. Calcified and tortuous aorta.  IMPRESSION: No active disease.   Electronically Signed   By: Lucienne Capers M.D.   On: 01/04/2015 03:12   Ct Head Wo Contrast  01/04/2015   CLINICAL DATA:  Fall with loss of consciousness. Slip on hardwood floor. On Coumadin with history of dementia.  EXAM: CT HEAD WITHOUT CONTRAST  TECHNIQUE: Contiguous axial images were obtained from the base of the skull through the vertex without intravenous contrast.  COMPARISON:  11/21/2014  FINDINGS: Unchanged cerebral atrophy. Unchanged chronic small vessel ischemic change.No intracranial hemorrhage, mass effect, or midline shift. No hydrocephalus. The basilar cisterns are patent. No evidence of territorial infarct. No intracranial fluid collection. Calvarium is intact. Included paranasal sinuses and mastoid air cells are well aerated.  IMPRESSION: Stable atrophy and chronic small vessel ischemic change without acute intracranial abnormality.   Electronically Signed   By: Jeb Levering M.D.   On: 01/04/2015 02:55   Dg Hip Unilat With Pelvis 2-3 Views Right  01/04/2015   CLINICAL DATA:  Right-sided hip pain after slip and fall.  EXAM: DG HIP (WITH OR WITHOUT PELVIS) 2-3V RIGHT  COMPARISON:  None.  FINDINGS: Primarily oblique fracture of the right proximal femur with intertrochanteric and subtrochanteric components. Minimal displacement. No significant angulation. Remainder the bony pelvis and left hip are intact. There are dense atherosclerotic calcifications of the pelvic vasculature. Tacks from bilateral hernia repairs noted.  IMPRESSION: Right  proximal femur fracture with mild displacement involving the intertrochanteric and subtrochanteric femur.   Electronically Signed   By: Jeb Levering M.D.   On: 01/04/2015 03:13    Review of Systems  Unable to perform ROS: dementia  Constitutional: Negative for fever and chills.  HENT: Negative for sore throat and tinnitus.   Eyes: Negative for blurred vision and redness.  Respiratory: Negative for cough and shortness of breath.   Cardiovascular: Negative for chest pain, palpitations, orthopnea and PND.  Gastrointestinal: Negative for nausea, vomiting, abdominal pain and diarrhea.  Genitourinary: Negative for dysuria, urgency and frequency.  Musculoskeletal: Negative for myalgias and joint pain.  Skin: Negative for rash.       No lesions  Neurological: Negative for speech change, focal weakness and weakness.  Endo/Heme/Allergies: Does not bruise/bleed easily.       No temperature intolerance  Psychiatric/Behavioral: Negative for depression and suicidal ideas.    Blood pressure 174/68, pulse 73, temperature 97.6 F (36.4 C), temperature source Oral, resp. rate 12, height $RemoveBe'5\' 11"'[id number]$  (1.803 m), weight 57.199 kg (126 lb 1.6 oz), SpO2 98 %. Physical Exam  Nursing note and vitals reviewed. Constitutional: He appears well-developed and well-nourished. No distress.  HENT:  Head: Normocephalic.  Mouth/Throat: Oropharynx is clear and moist.  Eyes: Conjunctivae and EOM are normal. Pupils are equal, round, and reactive to light. No scleral icterus.  Neck: Normal range of motion. Neck supple. No JVD present. No tracheal deviation present. No thyromegaly present.  Cardiovascular: An irregularly irregular rhythm present. Exam reveals no gallop and no friction rub.   No murmur heard. Respiratory: Effort normal and breath sounds normal. No respiratory distress. He has no wheezes.  GI:  Soft. Bowel sounds are normal. He exhibits no distension. There is no tenderness.  Genitourinary:  Foley catheter  in place  Musculoskeletal: He exhibits tenderness (right hip). He exhibits no edema.  Lymphadenopathy:    He has no cervical adenopathy.  Neurological: He is alert. No cranial nerve deficit. Coordination normal.  Skin: Skin is warm and dry. No rash noted. No erythema.  Psychiatric: He has a normal mood and affect. His behavior is normal. Judgment and thought content normal.     Assessment/Plan This is an 81 year old Caucasian male admitted for right femur fracture. 1. Femur fracture: The surgery has been consulted. I will try to manage the patient's pain and limit his movement to prevent further damage to his hip. The patient is a former smoker (more than 30 years ago) who has A. fib , anemia, CAD and chronic kidney disease which puts him at a moderate to high risk for surgery. 2. Bradycardia: Appears asymptomatic; rate controlled A. fib. We will continue the patient on anticoagulation tell on the surgery confirms if the patient will proceed for operation. Currently his INR is 2.7; recommend holding warfarin if plan is to proceed to surgery 3. Chronic kidney disease: Marginal worsening of GFR; we'll gently hydrate the patient and avoid nephrotoxic drugs 4. Hypertension: Controlled 5. Dementia: Continue SSRIs as well as Klonopin; the patient may need a sitter while on the floor as he worsens at night as is common for patients with dementia sundown 6. DVT prophylaxis: The patient is on full dose anticoagulation 7. GI prophylaxis: None The patient is a DO NOT RESUSCITATE. Time spent on admission orders and patient care approximately 40 minutes.  Harrie Foreman 01/04/2015, 4:49 AM

## 2015-01-04 NOTE — Clinical Social Work Note (Signed)
Clinical Social Work Assessment  Patient Details  Name: Derek [name] MRN: [id number] Date of Birth: [birth year]  Date of referral:  01/04/15               Reason for consult:  Facility Placement                Permission sought to share information with:  Chartered certified accountant granted to share information::  Yes, Verbal Permission Granted  Name::      [place]::   [place]   Relationship::     Contact Information:     Housing/Transportation Living arrangements for the past 2 months:  [place] of Information:  Power of [place], Adult Children Patient Interpreter Needed:  None Criminal Activity/Legal Involvement Pertinent to Current Situation/Hospitalization:  No - Comment as needed Significant Relationships:  Adult Children Lives with:  Adult Children Do you feel safe going back to the place where you live?  Yes Need for family participation in patient care:  Yes (Comment)  Care giving concerns: Patient lives with his daughter Candie Chroman in [place].    Social Worker assessment / plan: Holiday representative (CSW) received SNF consult. Patient has a femur fracture and is not medically clear for surgery. CSW met with patient's 2 daughters Sharee Pimple [phone] and Opal Sidles [phone]. Both daughters share HPOA and are both nurses. Sharee Pimple lives with patient in [place] and Opal Sidles lives in [place]. Per Opal Sidles she will be back to work in [employer] Monday and Tuesday and will then be on FMLA. Per daughter's patient had hired caregivers in the home and was on the waiting list for the [facility] day center. Per Opal Sidles patient recently put a deposit down at [facility] however they had to offer the bed to an independent living resident before they could offer it to patient. CSW discussed rehab and long term care with daughters. CSW explained that patient has to participate with PT in order to meet criteria for rehab.  Daughters reported that patient can pay privately for long term care if that is what is needed. Daughters are open to rehab, long term care, and hospice services depending on the outcome of the surgery. Daughter prefers [facility] or [facility].   FL2 complete and on chart.   Employment status:  Disabled (Comment on whether or not currently receiving Disability), Retired Forensic scientist:  Medicare PT Recommendations:  Not assessed at this time Information / Referral to community resources:  [place]  Patient/Family's Response to care: Patient's daughters are agreeable to SNF and prefer [facility] [facility] or [facility].   Patient/Family's Understanding of and Emotional Response to Diagnosis, Current Treatment, and Prognosis: Daughters thanked CSW for visit.   Emotional Assessment Appearance:  Appears stated age Attitude/Demeanor/Rapport:  Unable to Assess Affect (typically observed):  Unable to Assess Orientation:  Fluctuating Orientation (Suspected and/or reported Sundowners) Alcohol / Substance use:  Not Applicable Psych involvement (Current and /or in the community):  No (Comment)  Discharge Needs  Concerns to be addressed:  Discharge Planning Concerns Readmission within the last 30 days:  No Current discharge risk:  Cognitively Impaired, Chronically ill Barriers to Discharge:  Continued Medical Work up   Loralyn Freshwater, LCSW 01/04/2015, 5:20 PM

## 2015-01-05 ENCOUNTER — Inpatient Hospital Stay: Payer: Medicare Other | Admitting: Anesthesiology

## 2015-01-05 ENCOUNTER — Inpatient Hospital Stay: Payer: Medicare Other

## 2015-01-05 ENCOUNTER — Encounter
Admission: EM | Disposition: A | Discharge status: Hospice | Payer: Self-pay | Source: Home / Self Care | Attending: Internal Medicine

## 2015-01-05 HISTORY — PX: FEMUR IM NAIL: SHX1597

## 2015-01-05 LAB — PROTIME-INR
INR: 1.15
INR: 1.22
PROTHROMBIN TIME: 14.9 s (ref 11.4–15.0)
PROTHROMBIN TIME: 15.6 s — AB (ref 11.4–15.0)

## 2015-01-05 LAB — CBC WITH DIFFERENTIAL/PLATELET
BASOS ABS: 0.1 10*3/uL (ref 0–0.1)
BASOS PCT: 1 %
Eosinophils Absolute: 0 10*3/uL (ref 0–0.7)
Eosinophils Relative: 0 %
HEMATOCRIT: 25.7 % — AB (ref 40.0–52.0)
Hemoglobin: 8.4 g/dL — ABNORMAL LOW (ref 13.0–18.0)
Lymphocytes Relative: 4 %
Lymphs Abs: 0.4 10*3/uL — ABNORMAL LOW (ref 1.0–3.6)
MCH: 30.5 pg (ref 26.0–34.0)
MCHC: 32.7 g/dL (ref 32.0–36.0)
MCV: 93.3 fL (ref 80.0–100.0)
MONO ABS: 0.9 10*3/uL (ref 0.2–1.0)
Monocytes Relative: 10 %
NEUTROS ABS: 8.4 10*3/uL — AB (ref 1.4–6.5)
NEUTROS PCT: 85 %
Platelets: 191 10*3/uL (ref 150–440)
RBC: 2.76 MIL/uL — ABNORMAL LOW (ref 4.40–5.90)
RDW: 12.8 % (ref 11.5–14.5)
WBC: 9.9 10*3/uL (ref 3.8–10.6)

## 2015-01-05 LAB — BASIC METABOLIC PANEL
ANION GAP: 3 — AB (ref 5–15)
BUN: 52 mg/dL — ABNORMAL HIGH (ref 6–20)
CALCIUM: 8.2 mg/dL — AB (ref 8.9–10.3)
CO2: 21 mmol/L — ABNORMAL LOW (ref 22–32)
Chloride: 116 mmol/L — ABNORMAL HIGH (ref 101–111)
Creatinine, Ser: 3.33 mg/dL — ABNORMAL HIGH (ref 0.61–1.24)
GFR, EST AFRICAN AMERICAN: 19 mL/min — AB (ref >60)
GFR, EST NON AFRICAN AMERICAN: 16 mL/min — AB (ref >60)
GLUCOSE: 116 mg/dL — AB (ref 65–99)
Potassium: 6.1 mmol/L — ABNORMAL HIGH (ref 3.5–5.1)
Sodium: 140 mmol/L (ref 135–145)

## 2015-01-05 LAB — MRSA PCR SCREENING: MRSA BY PCR: NEGATIVE

## 2015-01-05 LAB — PREPARE RBC (CROSSMATCH)

## 2015-01-05 LAB — ABO/RH: ABO/RH(D): A POS

## 2015-01-05 SURGERY — INSERTION, INTRAMEDULLARY ROD, FEMUR
Anesthesia: Spinal | Site: Hip | Laterality: Right | Wound class: Clean

## 2015-01-05 MED ORDER — ONDANSETRON HCL 4 MG/2ML IJ SOLN: 4.0000 mg | Freq: Once | INTRAMUSCULAR | Status: DC | PRN

## 2015-01-05 MED ORDER — VANCOMYCIN HCL IN DEXTROSE 1-5 GM/200ML-% IV SOLN
1000.0000 mg | INTRAVENOUS | Status: DC
  Administered 2015-01-06 – 2015-01-08 (×2): 1000 mg via INTRAVENOUS
  Filled 2015-01-05 (×2): qty 200

## 2015-01-05 MED ORDER — CEFAZOLIN SODIUM 1-5 GM-% IV SOLN
1.0000 g | Freq: Four times a day (QID) | INTRAVENOUS | Status: AC
  Administered 2015-01-05 – 2015-01-06 (×3): 1 g via INTRAVENOUS
  Filled 2015-01-05 (×5): qty 50

## 2015-01-05 MED ORDER — EPHEDRINE SULFATE 50 MG/ML IJ SOLN
INTRAMUSCULAR | Status: DC | PRN
  Administered 2015-01-05: 10 mg via INTRAVENOUS
  Administered 2015-01-05: 5 mg via INTRAVENOUS

## 2015-01-05 MED ORDER — SODIUM CHLORIDE 0.9 % IV SOLN
Freq: Once | INTRAVENOUS | Status: AC
  Administered 2015-01-05: 10:00:00 via INTRAVENOUS

## 2015-01-05 MED ORDER — WARFARIN SODIUM 1 MG PO TABS: 1.5000 mg | ORAL_TABLET | ORAL | Status: DC

## 2015-01-05 MED ORDER — SODIUM CHLORIDE 0.9 % IV SOLN
INTRAVENOUS | Status: DC | PRN
  Administered 2015-01-05: 08:00:00 via INTRAVENOUS

## 2015-01-05 MED ORDER — PHENOL 1.4 % MT LIQD: 1.0000 | OROMUCOSAL | Status: DC | PRN

## 2015-01-05 MED ORDER — DILTIAZEM HCL 100 MG IV SOLR
5.0000 mg/h | INTRAVENOUS | Status: DC
  Administered 2015-01-05: 5 mg/h via INTRAVENOUS
  Administered 2015-01-05 – 2015-01-07 (×6): 15 mg/h via INTRAVENOUS
  Administered 2015-01-07: 10 mg/h via INTRAVENOUS
  Administered 2015-01-07 – 2015-01-08 (×3): 15 mg/h via INTRAVENOUS
  Administered 2015-01-09: 5 mg/h via INTRAVENOUS
  Administered 2015-01-09: 10 mg/h via INTRAVENOUS
  Filled 2015-01-05 (×14): qty 100

## 2015-01-05 MED ORDER — FENTANYL CITRATE (PF) 100 MCG/2ML IJ SOLN
INTRAMUSCULAR | Status: DC | PRN
  Administered 2015-01-05: 50 ug via INTRAVENOUS

## 2015-01-05 MED ORDER — WARFARIN SODIUM 1 MG PO TABS: 3.0000 mg | ORAL_TABLET | ORAL | Status: DC

## 2015-01-05 MED ORDER — FENTANYL CITRATE (PF) 100 MCG/2ML IJ SOLN: 25.0000 ug | INTRAMUSCULAR | Status: DC | PRN

## 2015-01-05 MED ORDER — PIPERACILLIN-TAZOBACTAM 3.375 G IVPB
3.3750 g | Freq: Two times a day (BID) | INTRAVENOUS | Status: DC
  Administered 2015-01-05 – 2015-01-09 (×10): 3.375 g via INTRAVENOUS
  Filled 2015-01-05 (×13): qty 50

## 2015-01-05 MED ORDER — VANCOMYCIN HCL IN DEXTROSE 1-5 GM/200ML-% IV SOLN
1000.0000 mg | INTRAVENOUS | Status: AC
  Administered 2015-01-05: 1000 mg via INTRAVENOUS
  Filled 2015-01-05: qty 200

## 2015-01-05 MED ORDER — FERROUS SULFATE 325 (65 FE) MG PO TABS: 325.0000 mg | ORAL_TABLET | Freq: Three times a day (TID) | ORAL | Status: DC

## 2015-01-05 MED ORDER — CEFAZOLIN SODIUM-DEXTROSE 2-3 GM-% IV SOLR: INTRAVENOUS | Status: DC | PRN

## 2015-01-05 MED ORDER — PROPOFOL 10 MG/ML IV BOLUS
INTRAVENOUS | Status: DC | PRN
  Administered 2015-01-05: 30 mg via INTRAVENOUS

## 2015-01-05 MED ORDER — BISACODYL 10 MG RE SUPP: 10.0000 mg | Freq: Every day | RECTAL | Status: DC | PRN

## 2015-01-05 MED ORDER — DILTIAZEM LOAD VIA INFUSION
10.0000 mg | Freq: Once | INTRAVENOUS | Status: DC
  Filled 2015-01-05: qty 10

## 2015-01-05 MED ORDER — WARFARIN - PHARMACIST DOSING INPATIENT: Freq: Every day | Status: DC

## 2015-01-05 MED ORDER — MENTHOL 3 MG MT LOZG: 1.0000 | LOZENGE | OROMUCOSAL | Status: DC | PRN

## 2015-01-05 MED ORDER — NEOMYCIN-POLYMYXIN B GU 40-200000 IR SOLN
Status: DC | PRN
  Administered 2015-01-05: 4 mL

## 2015-01-05 MED ORDER — HYDROCODONE-ACETAMINOPHEN 5-325 MG PO TABS: 1.0000 | ORAL_TABLET | Freq: Four times a day (QID) | ORAL | Status: DC | PRN

## 2015-01-05 MED ORDER — ALUM & MAG HYDROXIDE-SIMETH 200-200-20 MG/5ML PO SUSP: 30.0000 mL | ORAL | Status: DC | PRN

## 2015-01-05 MED ORDER — LORAZEPAM 2 MG/ML IJ SOLN
INTRAMUSCULAR | Status: AC
  Filled 2015-01-05: qty 1

## 2015-01-05 MED ORDER — FUROSEMIDE 10 MG/ML IJ SOLN
40.0000 mg | Freq: Once | INTRAMUSCULAR | Status: AC
  Administered 2015-01-05: 40 mg via INTRAVENOUS

## 2015-01-05 MED ORDER — MAGNESIUM CITRATE PO SOLN: 1.0000 | Freq: Once | ORAL | Status: DC | PRN

## 2015-01-05 MED ORDER — POLYETHYLENE GLYCOL 3350 17 G PO PACK: 17.0000 g | PACK | Freq: Every day | ORAL | Status: DC | PRN

## 2015-01-05 MED ORDER — CEFAZOLIN SODIUM-DEXTROSE 2-3 GM-% IV SOLR
INTRAVENOUS | Status: DC | PRN
  Administered 2015-01-05: 2 g via INTRAVENOUS

## 2015-01-05 SURGICAL SUPPLY — 35 items
BIT DRILL 4.3MMS DISTAL GRDTED (BIT) ×1 IMPLANT
BNDG COHESIVE 6X5 TAN STRL LF (GAUZE/BANDAGES/DRESSINGS) ×3 IMPLANT
CANISTER SUCT 1200ML W/VALVE (MISCELLANEOUS) ×3 IMPLANT
CORTICAL BONE SCR 5.0MM X 48MM (Screw) ×3 IMPLANT
DRAPE SURG 17X11 SM STRL (DRAPES) ×3 IMPLANT
DRAPE U-SHAPE 47X51 STRL (DRAPES) ×3 IMPLANT
DRILL 4.3MMS DISTAL GRADUATED (BIT) ×3
DRSG OPSITE POSTOP 3X4 (GAUZE/BANDAGES/DRESSINGS) ×6 IMPLANT
DRSG OPSITE POSTOP 4X14 (GAUZE/BANDAGES/DRESSINGS) ×3 IMPLANT
DRSG OPSITE POSTOP 4X6 (GAUZE/BANDAGES/DRESSINGS) ×3 IMPLANT
DURAPREP 26ML APPLICATOR (WOUND CARE) ×3 IMPLANT
GLOVE BIOGEL PI IND STRL 9 (GLOVE) ×1 IMPLANT
GLOVE BIOGEL PI INDICATOR 9 (GLOVE) ×2
GLOVE SURG 9.0 ORTHO LTXF (GLOVE) ×6 IMPLANT
GOWN STRL REUS TWL 2XL XL LVL4 (GOWN DISPOSABLE) ×3 IMPLANT
GOWN STRL REUS W/ TWL LRG LVL3 (GOWN DISPOSABLE) ×1 IMPLANT
GOWN STRL REUS W/TWL LRG LVL3 (GOWN DISPOSABLE) ×2
GUIDEPIN VERSANAIL DSP 3.2X444 ×3 IMPLANT
GUIDEWIRE BALL NOSE 100CM (WIRE) ×3 IMPLANT
HEMOVAC 400CC 10FR (MISCELLANEOUS) IMPLANT
HIP FRAC NAIL LAG SCR 10.5X100 (Orthopedic Implant) ×2 IMPLANT
KIT RM TURNOVER CYSTO AR (KITS) ×3 IMPLANT
MAT BLUE FLOOR 46X72 FLO (MISCELLANEOUS) ×3 IMPLANT
NAIL HIP FRAC RT 130 11MX400M (Nail) ×3 IMPLANT
NS IRRIG 1000ML POUR BTL (IV SOLUTION) ×3 IMPLANT
PACK HIP COMPR (MISCELLANEOUS) ×3 IMPLANT
PAD GROUND ADULT SPLIT (MISCELLANEOUS) ×3 IMPLANT
SCREW CANN THRD AFF 10.5X100 (Orthopedic Implant) ×1 IMPLANT
SCREW CORTICL BON 5.0MM X 48MM (Screw) ×1 IMPLANT
STAPLER SKIN PROX 35W (STAPLE) ×3 IMPLANT
SUCTION FRAZIER TIP 10 FR DISP (SUCTIONS) IMPLANT
SUT VIC AB 2-0 CT1 27 (SUTURE) ×2
SUT VIC AB 2-0 CT1 TAPERPNT 27 (SUTURE) ×1 IMPLANT
SUT VICRYL 0 AB UR-6 (SUTURE) ×3 IMPLANT
SYR 30ML LL (SYRINGE) ×3 IMPLANT

## 2015-01-05 NOTE — Care Management (Signed)
Notified by staff that patient went into A-fib peri-operatively and will be transferring to ICU prior to returning to ortho. [place] notified.

## 2015-01-05 NOTE — Transfer of Care (Signed)
Immediate Anesthesia Transfer of Care Note  Patient: Derek [name]  Procedure(s) Performed: Procedure(s): INTRAMEDULLARY (IM) NAIL FEMORAL (Right)  Patient Location: PACU  Anesthesia Type:Spinal  Level of Consciousness: patient cooperative and lethargic  Airway & Oxygen Therapy: Patient Spontanous Breathing and Patient connected to nasal cannula oxygen  Post-op Assessment: Report given to RN and Post -op Vital signs reviewed and stable  Post vital signs: Reviewed and stable  Last Vitals:  Filed Vitals:   01/05/15 0916  BP: 126/64  Pulse: 100  Temp: 36.7 C  Resp: 17    Complications: No apparent anesthesia complications

## 2015-01-05 NOTE — Consult Note (Signed)
[facility] Consultation Note  Patient ID: Derek [name], MRN: [id number], DOB/AGE: [birth year] 79 y.o. Admit date: 01/04/2015   Date of Consult: 01/05/2015 Primary Physician: [name]., MD Primary Cardiologist: None  Chief Complaint:  Chief Complaint  Patient presents with  . Fall  . Hip Pain   Reason for Consult: atrial flutter  HPI: 81 y.o. male with known coronary artery disease and essential hypertension who has had Alzheimer's dementia and was found down with a femur fracture. There appears to be aspiration and also spectral or E failure which is likely spurred on atrial flutter with rapid ventricular rate. The patient has been somewhat unable to talk and is in some respiratory distress at this time with no evidence of cardiac heart failure although still in atrial flutter with rapid ventricular rate at 120 bpm. Patient was placed on diltiazem drip which has helped somewhat although currently still having some issues. The patient has been on oxygen nonrebreather which is helping somewhat  Past Medical History  Diagnosis Date  . Undiagnosed cardiac murmurs   . CAD (coronary artery disease)   . Other and unspecified hyperlipidemia   . Unspecified essential hypertension   . Diaphragmatic hernia without mention of obstruction or gangrene   . Sensory hearing loss, unilateral   . Colon polyps   . Impaired fasting glucose   . Esophageal reflux   . Elevated prostate specific antigen (PSA)   . Unspecified disorder resulting from impaired renal function   . Unspecified disorder of kidney and ureter   . Alzheimer's dementia       Surgical History:  Past Surgical History  Procedure Laterality Date  . Polyp removal  1991  . Hop r/o  1994    HTN past mild MI Dr. Gwenlyn Found  . Stress cardiolite  4/99    nml. Dr. Percival Spanish. no change when done 02/06/03  . Colonoscopy  10/99    no polyps. Dr. Vira Agar  . Esophagogastroduodenoscopy  08/15/02    hh erosions of stomah. Dr.  Vira Agar  . Hernia repair  05/11/03  . Esophagogastroduodenoscopy  08/15/02    hh erosive gastropathy- duodenal mass   . Cystoscopy  5/07     thickening of bladder wall; other wise wnl     Home Meds: Prior to Admission medications   Medication Sig Start Date End Date Taking? Authorizing Provider  acetaminophen (TYLENOL) 500 MG tablet Take 1,000 mg by mouth every 6 (six) hours as needed for mild pain or headache.   Yes Historical Provider, MD  clonazePAM (KLONOPIN) 0.5 MG tablet Take 1 tablet by mouth 2 (two) times daily. 12/26/14 01/25/15 Yes Historical Provider, MD  diltiazem (CARDIZEM CD) 120 MG 24 hr capsule Take 120 mg by mouth 2 (two) times daily.   Yes Historical Provider, MD  finasteride (PROSCAR) 5 MG tablet Take 5 mg by mouth daily.   Yes Historical Provider, MD  furosemide (LASIX) 20 MG tablet Take 20 mg by mouth daily.   Yes Historical Provider, MD  galantamine (RAZADYNE) 8 MG tablet Take 8 mg by mouth 2 (two) times daily with a meal.   Yes Historical Provider, MD  losartan (COZAAR) 100 MG tablet Take 100 mg by mouth daily.   Yes Historical Provider, MD  pantoprazole (PROTONIX) 40 MG tablet Take 40 mg by mouth 2 (two) times daily.   Yes Historical Provider, MD  tamsulosin (FLOMAX) 0.4 MG CAPS capsule Take 0.4 mg by mouth daily. 03/26/12  Yes Historical Provider, MD  warfarin (COUMADIN)  3 MG tablet Take 1.5-3 mg by mouth at bedtime. 0.5 tablet on Monday, Wednesday, and Friday; 1 tablet on Tuesday, Thursday, Saturday, and Sunday   Yes Historical Provider, MD  citalopram (CELEXA) 20 MG tablet Take 1 tablet by mouth daily. 12/24/14   Historical Provider, MD  PARoxetine (PAXIL) 10 MG tablet Take 10 mg by mouth every morning.    Historical Provider, MD    Inpatient Medications:  .  ceFAZolin (ANCEF) IV  1 g Intravenous 4 times per day  . clonazePAM  0.5 mg Oral BID  . diltiazem  120 mg Oral BID  . docusate sodium  100 mg Oral BID  . ferrous sulfate  325 mg Oral TID PC  . finasteride  5  mg Oral Daily  . furosemide  20 mg Oral Daily  . galantamine  8 mg Oral BID WC  . LORazepam      . losartan  100 mg Oral Daily  . pantoprazole  40 mg Oral BID  . PARoxetine  10 mg Oral BH-q7a  . piperacillin-tazobactam (ZOSYN)  IV  3.375 g Intravenous Q12H  . sodium chloride  3 mL Intravenous Q12H  . tamsulosin  0.4 mg Oral Daily  . vancomycin  1,000 mg Intravenous STAT  . [START ON 01/06/2015] vancomycin  1,000 mg Intravenous Q48H  . warfarin  1.5 mg Oral Q M,W,F-1800   And  . [START ON 01/06/2015] warfarin  3 mg Oral Q T,Th,S,Su-1800  . Warfarin - Pharmacist Dosing Inpatient   Does not apply q1800   . sodium chloride 100 mL/hr at 01/05/15 0524  . diltiazem (CARDIZEM) infusion 15 mg/hr (01/05/15 1647)    Allergies:  Allergies  Allergen Reactions  . Pradaxa [Dabigatran Etexilate Mesylate] Other (See Comments)    Social History   Social History  . Marital Status: Widowed    Spouse Name: N/A  . Number of Children: N/A  . Years of Education: N/A   Occupational History  . Not on file.   Social History Main Topics  . Smoking status: Former Research scientist (life sciences)  . Smokeless tobacco: Not on file     Comment: quit in 1982  . Alcohol Use: No  . Drug Use: No  . Sexual Activity: Not on file   Other Topics Concern  . Not on file   Social History Narrative   Married, lives with wife. 2 daughters-out of home- 1 ER nurse at [employer]; 1 at [employer].    Retired (10/96)- [employer]      Family History  Problem Relation Age of Onset  . Cancer      aunt x 2. breast/ovarian/uterine cacner  . Depression Neg Hx   . Drug abuse Neg Hx     no alcohol abuse  . Stroke      grandfather     Review of Systems Unable to assess  Labs:  Recent Labs  01/04/15 0235  TROPONINI <0.03   Lab Results  Component Value Date   WBC 9.9 01/05/2015   HGB 8.4* 01/05/2015   HCT 25.7* 01/05/2015   MCV 93.3 01/05/2015   PLT 191 01/05/2015    Recent Labs Lab 01/04/15 0235  01/05/15 0646  NA 142 140  K 5.6* 6.1*  CL 120* 116*  CO2 22 21*  BUN 61* 52*  CREATININE 3.77* 3.33*  CALCIUM 8.2* 8.2*  PROT 6.0*  --   BILITOT 0.4  --   ALKPHOS 52  --   ALT 13*  --  AST 19  --   GLUCOSE 81 116*   No results found for: CHOL, HDL, LDLCALC, TRIG No results found for: DDIMER  Radiology/Studies:  Dg Chest 1 View  01/05/2015   CLINICAL DATA:  Increasing shortness of Breath following femoral rod placement  EXAM: CHEST  1 VIEW  COMPARISON:  01/04/2015  FINDINGS: Cardiac shadow is within normal limits. Postoperative changes are again seen. Aortic calcifications are again noted. The lungs are well aerated bilaterally with some chronic interstitial change. No focal abnormality is seen.  IMPRESSION: No active disease.   Electronically Signed   By: Inez Catalina M.D.   On: 01/05/2015 11:45   Dg Chest 1 View  01/04/2015   CLINICAL DATA:  Right hip and left hip pain. Unwitnessed fall. Patient on Coumadin. Preoperative study.  EXAM: CHEST  1 VIEW  COMPARISON:  11/03/2014  FINDINGS: Postoperative changes in the mediastinum. Heart size and pulmonary vascularity are normal. No focal airspace disease or consolidation. No pneumothorax. Calcified and tortuous aorta.  IMPRESSION: No active disease.   Electronically Signed   By: Lucienne Capers M.D.   On: 01/04/2015 03:12   Ct Head Wo Contrast  01/04/2015   CLINICAL DATA:  Fall with loss of consciousness. Slip on hardwood floor. On Coumadin with history of dementia.  EXAM: CT HEAD WITHOUT CONTRAST  TECHNIQUE: Contiguous axial images were obtained from the base of the skull through the vertex without intravenous contrast.  COMPARISON:  11/21/2014  FINDINGS: Unchanged cerebral atrophy. Unchanged chronic small vessel ischemic change.No intracranial hemorrhage, mass effect, or midline shift. No hydrocephalus. The basilar cisterns are patent. No evidence of territorial infarct. No intracranial fluid collection. Calvarium is intact. Included  paranasal sinuses and mastoid air cells are well aerated.  IMPRESSION: Stable atrophy and chronic small vessel ischemic change without acute intracranial abnormality.   Electronically Signed   By: Jeb Levering M.D.   On: 01/04/2015 02:55   Dg Hip Operative Unilat With Pelvis Right  01/05/2015   CLINICAL DATA:  Elective surgery.  EXAM: OPERATIVE right HIP (WITH PELVIS IF PERFORMED) 2 VIEWS  TECHNIQUE: Fluoroscopic spot image(s) were submitted for interpretation post-operatively.  COMPARISON:  Pelvis radiography 01/04/2015  FINDINGS: Intraoperative fluoroscopy shows placement of a femoral nail with dynamic femoral neck screw for treatment of a intertrochanteric and subtrochanteric right femur fracture. No evidence of intraoperative fracture or dislocation.  IMPRESSION: Fluoroscopy for proximal femur fracture fixation.   Electronically Signed   By: Monte Fantasia M.D.   On: 01/05/2015 09:30   Dg Hip Unilat With Pelvis 2-3 Views Right  01/04/2015   CLINICAL DATA:  Right-sided hip pain after slip and fall.  EXAM: DG HIP (WITH OR WITHOUT PELVIS) 2-3V RIGHT  COMPARISON:  None.  FINDINGS: Primarily oblique fracture of the right proximal femur with intertrochanteric and subtrochanteric components. Minimal displacement. No significant angulation. Remainder the bony pelvis and left hip are intact. There are dense atherosclerotic calcifications of the pelvic vasculature. Tacks from bilateral hernia repairs noted.  IMPRESSION: Right proximal femur fracture with mild displacement involving the intertrochanteric and subtrochanteric femur.   Electronically Signed   By: Jeb Levering M.D.   On: 01/04/2015 03:13   Dg Femur Port, Min 2 Views Right  01/05/2015   CLINICAL DATA:  Status post medullary rod placement  EXAM: RIGHT FEMUR PORTABLE 1 VIEW  COMPARISON:  None.  FINDINGS: Medullary rod is noted. The proximal femoral fracture is reduced. A fixation screw is noted both proximally and distally. Heavy vascular  calcifications  are noted. No acute bony abnormality or soft tissue abnormality is seen.  IMPRESSION: Status post ORIF of proximal right femoral fracture.   Electronically Signed   By: Inez Catalina M.D.   On: 01/05/2015 10:10    EKG: Atrial flutter with rapid ventricular rate  Weights: Filed Weights   01/04/15 0228 01/04/15 0505 01/05/15 0537  Weight: 126 lb 1.6 oz (57.199 kg) 136 lb 6.4 oz (61.871 kg) 143 lb 4.8 oz (65 kg)     Physical Exam: Blood pressure 169/96, pulse 127, temperature 98.3 F (36.8 C), temperature source Tympanic, resp. rate 22, height 5\' 6"  (1.676 m), weight 143 lb 4.8 oz (65 kg), SpO2 98 %. Body mass index is 23.14 kg/(m^2). General: Disheveled Head eyes ears nose throat: Normocephalic, atraumatic, sclera non-icteric, no xanthomas, nares are without discharge. No apparent thyromegaly and/or mass  Lungs: Increased respiratory effort.  Diffuse wheezes, no rales, diffuse rhonchi.  Heart: RRR with normal S1 S2. Apical murmur gallop, no rub, PMI is normal size and placement, carotid upstroke normal without bruit, jugular venous pressure is normal Abdomen: Soft, , non-distended with normoactive bowel sounds. No hepatomegaly. No rebound/guarding. No obvious abdominal masses. Abdominal aorta is normal size without bruit Extremities: Trace edema. no cyanosis, no clubbing, no ulcers  Peripheral : 2+ bilateral upper extremity pulses, 2+ bilateral femoral pulses, 2+ bilateral dorsal pedal pulse Neuro: Alert and oriented. No facial asymmetry. No focal deficit. Moves all extremities spontaneously. Musculoskeletal: Normal muscle tone without kyphosis     Assessment: 81 year old male with essential hypertension coronary artery disease Alzheimer's with femur fracture and acute atrial flutter with rapid ventricular rate likely secondary to illness as well as respiratory failure.  Plan: 1. Continue diltiazem drip to control atrial flutter for heart rate control with the heart rate  between 80 and 120 bpm  2. Echocardiogram for LV systolic dysfunction valvular heart disease contributing to above 3. No anticoagulation at this time other than deep venous thrombosis treatment due to concerns of bleeding risk and fall and possible surgical intervention if necessary 4. Continue aggressive pulmonary toilet and oxygen treatment for respiratory failure and possible aspiration Signed, Corey Skains M.D. [facility] 01/05/2015, 5:17 PM

## 2015-01-05 NOTE — Progress Notes (Signed)
Pt noted to have coarse rhonchi and labored respirations.  #7 nasal trumpet placed in patient's left nare.  Pt nasotracheally suctioned x 3 for large amount creamy secreations.  o2 saturation is 94%.

## 2015-01-05 NOTE — Progress Notes (Signed)
ANTICOAGULATION CONSULT NOTE - Initial Consult  Pharmacy Consult for Coumadin Indication: atrial fibrillation  Allergies  Allergen Reactions  . Pradaxa [Dabigatran Etexilate Mesylate] Other (See Comments)    Patient Measurements: Height: 5\' 6"  (167.6 cm) Weight: 143 lb 4.8 oz (65 kg) IBW/kg (Calculated) : 63.8   Vital Signs: Temp: 98.3 F (36.8 C) (08/26 1144) Temp Source: Tympanic (08/26 1022) BP: 163/99 mmHg (08/26 1144) Pulse Rate: 126 (08/26 1144)  Labs:  Recent Labs  01/04/15 0235 01/04/15 1650 01/05/15 0646  HGB 8.1*  --  8.4*  HCT 24.9*  --  25.7*  PLT 204  --  191  LABPROT 28.9* 22.9* 14.9  INR 2.72 2.01 1.15  CREATININE 3.77*  --  3.33*  TROPONINI <0.03  --   --     Estimated Creatinine Clearance: 15.7 mL/min (by C-G formula based on Cr of 3.33).   Medical History: Past Medical History  Diagnosis Date  . Undiagnosed cardiac murmurs   . CAD (coronary artery disease)   . Other and unspecified hyperlipidemia   . Unspecified essential hypertension   . Diaphragmatic hernia without mention of obstruction or gangrene   . Sensory hearing loss, unilateral   . Colon polyps   . Impaired fasting glucose   . Esophageal reflux   . Elevated prostate specific antigen (PSA)   . Unspecified disorder resulting from impaired renal function   . Unspecified disorder of kidney and ureter   . Alzheimer's dementia     Medications:  Prescriptions prior to admission  Medication Sig Dispense Refill Last Dose  . acetaminophen (TYLENOL) 500 MG tablet Take 1,000 mg by mouth every 6 (six) hours as needed for mild pain or headache.   01/03/2015 at Unknown time  . clonazePAM (KLONOPIN) 0.5 MG tablet Take 1 tablet by mouth 2 (two) times daily.   01/03/2015 at Unknown time  . diltiazem (CARDIZEM CD) 120 MG 24 hr capsule Take 120 mg by mouth 2 (two) times daily.   01/03/2015 at Unknown time  . finasteride (PROSCAR) 5 MG tablet Take 5 mg by mouth daily.   01/03/2015 at Unknown time   . furosemide (LASIX) 20 MG tablet Take 20 mg by mouth daily.   01/03/2015 at Unknown time  . galantamine (RAZADYNE) 8 MG tablet Take 8 mg by mouth 2 (two) times daily with a meal.   01/03/2015 at Unknown time  . losartan (COZAAR) 100 MG tablet Take 100 mg by mouth daily.   01/03/2015 at Unknown time  . pantoprazole (PROTONIX) 40 MG tablet Take 40 mg by mouth 2 (two) times daily.   01/03/2015 at Unknown time  . tamsulosin (FLOMAX) 0.4 MG CAPS capsule Take 0.4 mg by mouth daily.   01/03/2015 at Unknown time  . warfarin (COUMADIN) 3 MG tablet Take 1.5-3 mg by mouth at bedtime. 0.5 tablet on Monday, Wednesday, and Friday; 1 tablet on Tuesday, Thursday, Saturday, and Sunday   01/03/2015 at Unknown time  . citalopram (CELEXA) 20 MG tablet Take 1 tablet by mouth daily.  5 Not Taking at Unknown time  . PARoxetine (PAXIL) 10 MG tablet Take 10 mg by mouth every morning.   Not Taking at Unknown time   Scheduled:  .  ceFAZolin (ANCEF) IV  1 g Intravenous 4 times per day  . clonazePAM  0.5 mg Oral BID  . diltiazem  120 mg Oral BID  . diltiazem  10 mg Intravenous Once  . docusate sodium  100 mg Oral BID  . ferrous sulfate  325 mg Oral TID PC  . finasteride  5 mg Oral Daily  . furosemide  20 mg Oral Daily  . galantamine  8 mg Oral BID WC  . losartan  100 mg Oral Daily  . pantoprazole  40 mg Oral BID  . PARoxetine  10 mg Oral BH-q7a  . sodium chloride  3 mL Intravenous Q12H  . tamsulosin  0.4 mg Oral Daily  . warfarin  1.5 mg Oral Q M,W,F-1800   And  . [START ON 01/06/2015] warfarin  3 mg Oral Q T,Th,S,Su-1800  . Warfarin - Pharmacist Dosing Inpatient   Does not apply q1800   Infusions:  . sodium chloride 100 mL/hr at 01/05/15 0524  . diltiazem (CARDIZEM) infusion 10 mg/hr (01/05/15 1306)   PRN: acetaminophen, alum & mag hydroxide-simeth, bisacodyl, HYDROcodone-acetaminophen, magnesium citrate, menthol-cetylpyridinium **OR** phenol, morphine injection, ondansetron **OR** ondansetron (ZOFRAN) IV,  polyethylene glycol  Assessment: 81 y/o M on warfarin PTA admitted for hip fracture repair developing aflutter after surgery. INR was therapeutic on admission on home dose of Coumadin 1.5 mg MWF and 3 mg TThSaSu. Patient received phytonadione 10 mg iv x 2 prior to surgery.   Goal of Therapy:  INR 2-3  Plan:  Will continue warfarin outpatient dosing of Coumadin 1.5 mg MWF and 3 mg TThSaSu and f/u AM INR.   Ulice Dash D 01/05/2015,1:15 PM

## 2015-01-05 NOTE — Anesthesia Preprocedure Evaluation (Addendum)
Anesthesia Evaluation  Patient identified by MRN, date of birth, ID band Patient awake    Reviewed: Allergy & Precautions, NPO status , Patient's Chart, lab work & pertinent test results  Airway Mallampati: III       Dental  (+) Poor Dentition   Pulmonary former smoker,  + rhonchi         Cardiovascular hypertension, Pt. on medications + CAD Normal cardiovascular exam    Neuro/Psych    GI/Hepatic GERD-  ,  Endo/Other    Renal/GU CRFRenal disease     Musculoskeletal   Abdominal Normal abdominal exam  (+)   Peds  Hematology  (+) anemia ,   Anesthesia Other Findings   Reproductive/Obstetrics                           Anesthesia Physical Anesthesia Plan  ASA: III  Anesthesia Plan: Spinal   Post-op Pain Management:    Induction:   Airway Management Planned: Nasal Cannula  Additional Equipment:   Intra-op Plan:   Post-operative Plan:   Informed Consent: I have reviewed the patients History and Physical, chart, labs and discussed the procedure including the risks, benefits and alternatives for the proposed anesthesia with the patient or authorized representative who has indicated his/her understanding and acceptance.     Plan Discussed with: CRNA  Anesthesia Plan Comments:         Anesthesia Quick Evaluation

## 2015-01-05 NOTE — Anesthesia Procedure Notes (Signed)
Spinal  Start time: 01/05/2015 8:00 AM End time: 01/05/2015 8:05 AM Reason for block: at surgeon's request Staffing Anesthesiologist: Marline Backbone F Performed by: anesthesiologist  Preanesthetic Checklist Completed: patient identified, site marked, surgical consent, pre-op evaluation, timeout performed, IV checked, risks and benefits discussed, monitors and equipment checked and at surgeon's request Spinal Block Patient position: right lateral decubitus Prep: Betadine Patient monitoring: heart rate and blood pressure Approach: midline Location: L3-4 Needle Needle type: Quincke  Needle gauge: 22 G Needle length: 9 cm Needle insertion depth: 5 cm Assessment Sensory level: T8

## 2015-01-05 NOTE — Op Note (Signed)
DATE OF SURGERY:  01/05/2015  TIME: 9:19 AM  PATIENT NAME:  Derek [name]  AGE: 81 y.o.  PRE-OPERATIVE DIAGNOSIS:  RIGHT HIP FRACTURE  POST-OPERATIVE DIAGNOSIS:  SAME  PROCEDURE: RIGHT INTRAMEDULLARY (IM) NAIL FEMORAL  SURGEON:  Ammiel Guiney  OPERATIVE IMPLANTS: Biomet long Affixus nail 11x400, 100 mm lag screw with a 48 mm distal interlocking screw  PREOPERATIVE INDICATIONS:  Derek [name] is a 81 y.o. year old who fell and suffered a hip fracture. He was brought into the ER and then admitted and medically cleared for surgical intervention.    The risks, benefits and alternatives were discussed with the patient and their family.  The risks include but are not limited to infection, bleeding, nerve or blood vessel injury, malunion, nonunion, hardware prominence, hardware failure, change in leg lengths or lower extremity rotation need for further surgery including hardware removal with conversion to a total hip arthroplasty. Medical risks include but are not limited to DVT and pulmonary embolism, myocardial infarction, stroke, pneumonia, respiratory failure and death. The patient and their family understood these risks and wished to proceed with surgery.  OPERATIVE PROCEDURE:  The patient was brought to the operating room and placed in the supine position on the fracture table.. General anesthesia was administered.  A closed reduction was performed under C-arm guidance.  The fracture reduction was confirmed on both AP and lateral views. A time out was performed to verify the patient's name, date of birth, medical record number, correct site of surgery correct procedure to be performed. The timeout was also used to verify the patient received antibiotics and all appropriate instruments, implants and radiographic studies were available in the room. Once all in attendance were in agreement, the case began. The patient was prepped and draped in a sterile fashion. He received preoperative 2g of  Kefzol.  An incision was made proximal to the greater trochanter in line with the femur. A guidewire was placed over the tip of the greater trochanter and advanced into the proximal femur to the level of the lesser trochanter.  Confirmation of the drill pin position was made on AP and lateral C-arm images.  The threaded guidepin was then overdrilled with the proximal femoral drill.  The long ball-tipped guidewire was then placed down the femoral shaft. The position both proximally and distally was confirmed on AP and lateral C-arm images. The length of the interosseous guidewire was measured with a depth gauge and measured to be 400 mm in length. Sequential reamers were then inserted down the femoral canal over the ball-tipped guidewire up to 13 mm. An 11 x 49mm long Affixus nail was then inserted into the proximal femur, across the fracture site and down the femoral shaft. Its position was confirmed on AP and lateral C-arm images both proximally and distally.   Once the nail was completely seated, the drill guide for the lag screw was placed through the guide arm for the Affixus nail. A guidepin was then placed through this drill guide and advanced through the lateral cortex of the femur, across the fracture site and into the femoral head achieving a tip apex distance of less than 25 mm. The length of the drill pin was measured, and then the drill for the lag screw was advanced through the lateral cortex, across the fracture site and up into the femoral head to the depth of the lag screw..  The lag screw was then advanced by hand into position across the fracture site into the femoral  head. Its final position was confirmed on AP and lateral C-arm images. Compression was applied as traction was carefully released. The set screw in the top of the intramedullary rod was tightened by hand using a screwdriver. It was backed off a quarter turn to allow for compression at the fracture site.  The distal interlocking  screw was then placed using the perfect circle technique. The drill for the distal interlocking screw was then advanced bicortically using a freehand technique.. The depth of this drill was measured to 48 mm. A distal interlocking screw with the length measured was then inserted by hand through the guide arm. Final C-arm images of the entire intramedullary construct were taken in both the AP and lateral planes.   The wounds were irrigated copiously and closed with 0 Vicryl for closure of the deep fashion and 2-0 Vicryl for his subcutaneous closure. The skin was approximated with staples. A dry sterile dressing was applied. I was scrubbed and present the entire case and all sharp and instrument counts were correct at the conclusion of the case. Patient was transferred to hospital bed and brought to PACU in stable condition. I spoke with the patient's family in the postop consultation room to let them know the case had gone without complication and the patient was stable in the recovery room.   Timoteo Gaul, MD

## 2015-01-05 NOTE — Anesthesia Postprocedure Evaluation (Signed)
  Anesthesia Post-op Note  Patient: Derek [name]  Procedure(s) Performed: Procedure(s): INTRAMEDULLARY (IM) NAIL FEMORAL (Right)  Anesthesia type:Spinal  Patient location: PACU  Post pain: Pain level controlled  Post assessment: Post-op Vital signs reviewed, Patient's Cardiovascular Status Stable, Respiratory Function Stable, Patent Airway and No signs of Nausea or vomiting  Post vital signs: Reviewed and stable  Last Vitals:  Filed Vitals:   01/05/15 0916  BP: 126/64  Pulse: 100  Temp: 36.8 C  Resp: 17    Level of consciousness: awake, alert  and patient cooperative  Complications: No apparent anesthesia complications

## 2015-01-05 NOTE — Progress Notes (Addendum)
I was called to see patient of Dr. Sabra Heck who underwent pinning right femur. He is in PACU. In PACU patient was noted to have hypoxia and a flutter with HRs in 120s-130s. He is currently on NRB at 10 liters. He is nonverbal at baseline with severe dementia. He underwent spinal anesthesia and received propofol, ephidrine and FENTANYL.  His EKG shows a flutter HR 129.  GEN: lethargic responds slowly CVS irr, irr tachy 2/6 SEM LUNGS: rhoncherous breath sounds bilaterally. Increased work of breathing. ABD: BS + NT ND EXT no edema cyanosis  A/P  1. Acute hypoxic resp failure from a flutter, lethargy ?? Pulmonary edema?? Aspiration?? - CXR stat - LASIX 40 IV once - transfer to step down (pt DNR)  2. Hx a fib on coumadin now in a flutter: try dilt and may need dilt gtt Restart coumadin when ok with ORTHo INR for am   3. CKD 4. alzheimers [place]    DNR Will transfer to step down orders written Critical care time 30 minutes will discuss with family who is on way to PACU

## 2015-01-05 NOTE — Progress Notes (Signed)
ANTIBIOTIC CONSULT NOTE - INITIAL  Pharmacy Consult for Vancomycin/Zosyn Indication: PNA  Allergies  Allergen Reactions  . Pradaxa [Dabigatran Etexilate Mesylate] Other (See Comments)    Patient Measurements: Height: 5\' 6"  (167.6 cm) Weight: 143 lb 4.8 oz (65 kg) IBW/kg (Calculated) : 63.8   Vital Signs: Temp: 98.3 F (36.8 C) (08/26 1144) Temp Source: Tympanic (08/26 1022) BP: 147/96 mmHg (08/26 1400) Pulse Rate: 129 (08/26 1400) Intake/Output from previous day: 08/25 0701 - 08/26 0700 In: 2306.3 [P.O.:240; I.V.:2066.3] Out: 2200 [Urine:2200] Intake/Output from this shift: Total I/O In: 1067.3 [I.V.:467.3; Blood:600] Out: 1000 [Urine:950; Blood:50]  Labs:  Recent Labs  01/04/15 0235 01/05/15 0646  WBC 5.6 9.9  HGB 8.1* 8.4*  PLT 204 191  CREATININE 3.77* 3.33*   Estimated Creatinine Clearance: 15.7 mL/min (by C-G formula based on Cr of 3.33). No results for input(s): VANCOTROUGH, VANCOPEAK, VANCORANDOM, GENTTROUGH, GENTPEAK, GENTRANDOM, TOBRATROUGH, TOBRAPEAK, TOBRARND, AMIKACINPEAK, AMIKACINTROU, AMIKACIN in the last 72 hours.   Microbiology: Recent Results (from the past 720 hour(s))  Surgical pcr screen     Status: None   Collection Time: 01/04/15 10:58 AM  Result Value Ref Range Status   MRSA, PCR NEGATIVE NEGATIVE Final   Staphylococcus aureus NEGATIVE NEGATIVE Final    Comment:        The Xpert SA Assay (FDA approved for NASAL specimens in patients over 2 years of age), is one component of a comprehensive surveillance program.  Test performance has been validated by [facility] for patients greater than or equal to 62 year old. It is not intended to diagnose infection nor to guide or monitor treatment.     Medical History: Past Medical History  Diagnosis Date  . Undiagnosed cardiac murmurs   . CAD (coronary artery disease)   . Other and unspecified hyperlipidemia   . Unspecified essential hypertension   . Diaphragmatic hernia without  mention of obstruction or gangrene   . Sensory hearing loss, unilateral   . Colon polyps   . Impaired fasting glucose   . Esophageal reflux   . Elevated prostate specific antigen (PSA)   . Unspecified disorder resulting from impaired renal function   . Unspecified disorder of kidney and ureter   . Alzheimer's dementia     Medications:  Scheduled:  .  ceFAZolin (ANCEF) IV  1 g Intravenous 4 times per day  . clonazePAM  0.5 mg Oral BID  . diltiazem  120 mg Oral BID  . docusate sodium  100 mg Oral BID  . ferrous sulfate  325 mg Oral TID PC  . finasteride  5 mg Oral Daily  . furosemide  20 mg Oral Daily  . galantamine  8 mg Oral BID WC  . LORazepam      . losartan  100 mg Oral Daily  . pantoprazole  40 mg Oral BID  . PARoxetine  10 mg Oral BH-q7a  . piperacillin-tazobactam (ZOSYN)  IV  3.375 g Intravenous Q12H  . sodium chloride  3 mL Intravenous Q12H  . tamsulosin  0.4 mg Oral Daily  . vancomycin  1,000 mg Intravenous STAT  . [START ON 01/06/2015] vancomycin  1,000 mg Intravenous Q48H  . warfarin  1.5 mg Oral Q M,W,F-1800   And  . [START ON 01/06/2015] warfarin  3 mg Oral Q T,Th,S,Su-1800  . Warfarin - Pharmacist Dosing Inpatient   Does not apply q1800   Infusions:  . sodium chloride 100 mL/hr at 01/05/15 0524  . diltiazem (CARDIZEM) infusion 12.5 mg/hr (01/05/15 1400)  PRN: acetaminophen, alum & mag hydroxide-simeth, bisacodyl, HYDROcodone-acetaminophen, magnesium citrate, menthol-cetylpyridinium **OR** phenol, morphine injection, ondansetron **OR** ondansetron (ZOFRAN) IV, polyethylene glycol  Assessment: 81 y/o M s/p femur fracture repair who developed aflutter and later aspirated to begin empiric abx.   Goal of Therapy:  Vancomycin trough level 15-20 mcg/ml  Plan:  1. Vancomycin 1000 mg iv q 48 hours with stacked dosing. Will defer ordering a trough for now and f/u renal function.  2. Zosyn 3.375 g EI q 12 hours.   Ulice Dash D 01/05/2015,3:35 PM

## 2015-01-05 NOTE — Progress Notes (Signed)
Derek [name] is a 81 y.o. male   SUBJECTIVE: Patient suffered  fracturing left proximal femur.  Had surgery this am but decompensated witth hypoxia, A fluter , requiring NRBM Transferred to step down.  CXR negative, Currently on 50% FM, ON diltiazem drip Lots of secretions on nasal suctioning.  ______________________________________________________________________  ROS: Review of systems is unremarkable for any active cardiac,respiratory, GI, GU, hematologic, neurologic or psychiatric systems, 10 systems reviewed.  Marland Kitchen  ceFAZolin (ANCEF) IV  1 g Intravenous 4 times per day  . clonazePAM  0.5 mg Oral BID  . diltiazem  120 mg Oral BID  . diltiazem  10 mg Intravenous Once  . docusate sodium  100 mg Oral BID  . ferrous sulfate  325 mg Oral TID PC  . finasteride  5 mg Oral Daily  . furosemide  20 mg Oral Daily  . galantamine  8 mg Oral BID WC  . losartan  100 mg Oral Daily  . pantoprazole  40 mg Oral BID  . PARoxetine  10 mg Oral BH-q7a  . sodium chloride  3 mL Intravenous Q12H  . tamsulosin  0.4 mg Oral Daily  . warfarin  1.5 mg Oral Q M,W,F-1800   And  . [START ON 01/06/2015] warfarin  3 mg Oral Q T,Th,S,Su-1800  . Warfarin - Pharmacist Dosing Inpatient   Does not apply q1800   acetaminophen, alum & mag hydroxide-simeth, bisacodyl, HYDROcodone-acetaminophen, magnesium citrate, menthol-cetylpyridinium **OR** phenol, morphine injection, ondansetron **OR** ondansetron (ZOFRAN) IV, polyethylene glycol   Past Medical History  Diagnosis Date  . Undiagnosed cardiac murmurs   . CAD (coronary artery disease)   . Other and unspecified hyperlipidemia   . Unspecified essential hypertension   . Diaphragmatic hernia without mention of obstruction or gangrene   . Sensory hearing loss, unilateral   . Colon polyps   . Impaired fasting glucose   . Esophageal reflux   . Elevated prostate specific antigen (PSA)   . Unspecified disorder resulting from impaired renal function   . Unspecified  disorder of kidney and ureter   . Alzheimer's dementia     Past Surgical History  Procedure Laterality Date  . Polyp removal  1991  . Hop r/o  1994    HTN past mild MI Dr. Gwenlyn Found  . Stress cardiolite  4/99    nml. Dr. Percival Spanish. no change when done 02/06/03  . Colonoscopy  10/99    no polyps. Dr. Vira Agar  . Esophagogastroduodenoscopy  08/15/02    hh erosions of stomah. Dr. Vira Agar  . Hernia repair  05/11/03  . Esophagogastroduodenoscopy  08/15/02    hh erosive gastropathy- duodenal mass   . Cystoscopy  5/07     thickening of bladder wall; other wise wnl    PHYSICAL EXAM:  BP 147/96 mmHg  Pulse 129  Temp(Src) 98.3 F (36.8 C) (Tympanic)  Resp 27  Ht 5\' 6"  (1.676 m)  Wt 65 kg (143 lb 4.8 oz)  BMI 23.14 kg/m2  SpO2 98%  Wt Readings from Last 3 Encounters:  01/05/15 65 kg (143 lb 4.8 oz)  11/21/14 65.772 kg (145 lb)  11/03/14 58.968 kg (130 lb)           BP Readings from Last 3 Encounters:  01/05/15 147/96  11/22/14 185/99  11/04/14 166/67   Constitutional: ill appearing Neck: supple, no thyromegaly Respiratory: bilateral rhonchi Cardiovascular: IRR, tachy Abdomen: soft, nml BS, nontender Extremities: no edema, moderate right anterior tenderness Neuro: lethargic  ASSESSMENT/PLAN: Resp failure- likley aspiration pna vs  pulm edema from tachycarida - start abx vanco and zosyn for HCAP and aspiration pulm toilet diurese    A flutter with RVR - on dilt drip- consult cardiology Gentle diuresis - got lasix in [facility] coumadin   Femoral fracture-orthopedic evaluation - s/p surgery  holding Coumadin but may restart tonight  Progressive aphasia-associated dementia associated with delirium, galantamine and twice a day Klonopin  CKD4-baseline creatinine 3.7 with associated anemia Overall poor prognosis, will need skilled nursing Pt is DNR

## 2015-01-05 NOTE — Progress Notes (Signed)
Patient transferred to ICU from PACU. Patient was on 1A. SNF search has been initiated. Patient 2 daughter's Opal Sidles and Sharee Pimple share HPOA. CSW gave report to ICU CSW. CSW will continue to follow and assist as needed.   Blima Rich, [place]  [phone]

## 2015-01-05 NOTE — Progress Notes (Signed)
Pt maxed on Cardizem, HR continues to remain 129, notified Nehemiah Massed, pt unable to take PO meds due to drowsiness. Nehemiah Massed stated HR due to respiratory failure, will continue to reassess.

## 2015-01-05 NOTE — Progress Notes (Signed)
Subjective:  Patient transferred to ICU from PACU postop. He had an intramedullary fixation for right greater trochanteric hip fracture this morning. Patient went into a flutter and PACU. Episodes of desaturation as well. I evaluated the patient in ICU 4.  Patient is alert. He has a face mask. Patient wearing minutes to prevent pulling out IV lines. Patient has history of dementia.  Objective:   VITALS:   Filed Vitals:   01/05/15 1100 01/05/15 1115 01/05/15 1131 01/05/15 1144  BP: 154/101 165/96 152/93 163/99  Pulse: 123 123 126 126  Temp: 98.3 F (36.8 C)   98.3 F (36.8 C)  TempSrc:      Resp: 17 19 20 20   Height:      Weight:      SpO2: 92% 91% 92% 92%    PHYSICAL EXAM:  Right lower extremity exam: Patient spontaneous movements of his right lower extremity including the toes and ankle. Intact pulses distally Incision: dressing C/D/I No cellulitis present Compartment soft  LABS  Results for orders placed or performed during the hospital encounter of 01/04/15 (from the past 24 hour(s))  Protime-INR     Status: Abnormal   Collection Time: 01/04/15  4:50 PM  Result Value Ref Range   Prothrombin Time 22.9 (H) 11.4 - 15.0 seconds   INR 2.01   Prepare RBC     Status: None   Collection Time: 01/05/15  6:45 AM  Result Value Ref Range   Order Confirmation ORDER PROCESSED BY BLOOD BANK   Protime-INR     Status: None   Collection Time: 01/05/15  6:46 AM  Result Value Ref Range   Prothrombin Time 14.9 11.4 - 15.0 seconds   INR 5.73   Basic metabolic panel     Status: Abnormal   Collection Time: 01/05/15  6:46 AM  Result Value Ref Range   Sodium 140 135 - 145 mmol/L   Potassium 6.1 (H) 3.5 - 5.1 mmol/L   Chloride 116 (H) 101 - 111 mmol/L   CO2 21 (L) 22 - 32 mmol/L   Glucose, Bld 116 (H) 65 - 99 mg/dL   BUN 52 (H) 6 - 20 mg/dL   Creatinine, Ser 3.33 (H) 0.61 - 1.24 mg/dL   Calcium 8.2 (L) 8.9 - 10.3 mg/dL   GFR calc non Af Amer 16 (L) >60 mL/min   GFR calc Af Amer 19  (L) >60 mL/min   Anion gap 3 (L) 5 - 15  CBC with Differential/Platelet     Status: Abnormal   Collection Time: 01/05/15  6:46 AM  Result Value Ref Range   WBC 9.9 3.8 - 10.6 K/uL   RBC 2.76 (L) 4.40 - 5.90 MIL/uL   Hemoglobin 8.4 (L) 13.0 - 18.0 g/dL   HCT 25.7 (L) 40.0 - 52.0 %   MCV 93.3 80.0 - 100.0 fL   MCH 30.5 26.0 - 34.0 pg   MCHC 32.7 32.0 - 36.0 g/dL   RDW 12.8 11.5 - 14.5 %   Platelets 191 150 - 440 K/uL   Neutrophils Relative % 85 %   Neutro Abs 8.4 (H) 1.4 - 6.5 K/uL   Lymphocytes Relative 4 %   Lymphs Abs 0.4 (L) 1.0 - 3.6 K/uL   Monocytes Relative 10 %   Monocytes Absolute 0.9 0.2 - 1.0 K/uL   Eosinophils Relative 0 %   Eosinophils Absolute 0.0 0 - 0.7 K/uL   Basophils Relative 1 %   Basophils Absolute 0.1 0 - 0.1 K/uL  ABO/Rh  Status: None   Collection Time: 01/05/15  6:46 AM  Result Value Ref Range   ABO/RH(D) A POS   Type and screen     Status: None (Preliminary result)   Collection Time: 01/05/15  8:04 AM  Result Value Ref Range   ABO/RH(D) A POS    Antibody Screen NEG    Sample Expiration 01/08/2015    Unit Number [id number]    Blood Component Type RBC, LR IRR    Unit division 00    Status of Unit ISSUED    Transfusion Status OK TO TRANSFUSE    Crossmatch Result Compatible    Unit Number [id number]    Blood Component Type RBC, LR IRR    Unit division 00    Status of Unit ALLOCATED    Transfusion Status OK TO TRANSFUSE    Crossmatch Result Compatible     Dg Chest 1 View  01/05/2015   CLINICAL DATA:  Increasing shortness of Breath following femoral rod placement  EXAM: CHEST  1 VIEW  COMPARISON:  01/04/2015  FINDINGS: Cardiac shadow is within normal limits. Postoperative changes are again seen. Aortic calcifications are again noted. The lungs are well aerated bilaterally with some chronic interstitial change. No focal abnormality is seen.  IMPRESSION: No active disease.   Electronically Signed   By: Inez Catalina M.D.   On: 01/05/2015 11:45    Dg Chest 1 View  01/04/2015   CLINICAL DATA:  Right hip and left hip pain. Unwitnessed fall. Patient on Coumadin. Preoperative study.  EXAM: CHEST  1 VIEW  COMPARISON:  11/03/2014  FINDINGS: Postoperative changes in the mediastinum. Heart size and pulmonary vascularity are normal. No focal airspace disease or consolidation. No pneumothorax. Calcified and tortuous aorta.  IMPRESSION: No active disease.   Electronically Signed   By: Lucienne Capers M.D.   On: 01/04/2015 03:12   Ct Head Wo Contrast  01/04/2015   CLINICAL DATA:  Fall with loss of consciousness. Slip on hardwood floor. On Coumadin with history of dementia.  EXAM: CT HEAD WITHOUT CONTRAST  TECHNIQUE: Contiguous axial images were obtained from the base of the skull through the vertex without intravenous contrast.  COMPARISON:  11/21/2014  FINDINGS: Unchanged cerebral atrophy. Unchanged chronic small vessel ischemic change.No intracranial hemorrhage, mass effect, or midline shift. No hydrocephalus. The basilar cisterns are patent. No evidence of territorial infarct. No intracranial fluid collection. Calvarium is intact. Included paranasal sinuses and mastoid air cells are well aerated.  IMPRESSION: Stable atrophy and chronic small vessel ischemic change without acute intracranial abnormality.   Electronically Signed   By: Jeb Levering M.D.   On: 01/04/2015 02:55   Dg Hip Operative Unilat With Pelvis Right  01/05/2015   CLINICAL DATA:  Elective surgery.  EXAM: OPERATIVE right HIP (WITH PELVIS IF PERFORMED) 2 VIEWS  TECHNIQUE: Fluoroscopic spot image(s) were submitted for interpretation post-operatively.  COMPARISON:  Pelvis radiography 01/04/2015  FINDINGS: Intraoperative fluoroscopy shows placement of a femoral nail with dynamic femoral neck screw for treatment of a intertrochanteric and subtrochanteric right femur fracture. No evidence of intraoperative fracture or dislocation.  IMPRESSION: Fluoroscopy for proximal femur fracture fixation.    Electronically Signed   By: Monte Fantasia M.D.   On: 01/05/2015 09:30   Dg Hip Unilat With Pelvis 2-3 Views Right  01/04/2015   CLINICAL DATA:  Right-sided hip pain after slip and fall.  EXAM: DG HIP (WITH OR WITHOUT PELVIS) 2-3V RIGHT  COMPARISON:  None.  FINDINGS: Primarily oblique fracture of the  right proximal femur with intertrochanteric and subtrochanteric components. Minimal displacement. No significant angulation. Remainder the bony pelvis and left hip are intact. There are dense atherosclerotic calcifications of the pelvic vasculature. Tacks from bilateral hernia repairs noted.  IMPRESSION: Right proximal femur fracture with mild displacement involving the intertrochanteric and subtrochanteric femur.   Electronically Signed   By: Jeb Levering M.D.   On: 01/04/2015 03:13   Dg Femur Port, Min 2 Views Right  01/05/2015   CLINICAL DATA:  Status post medullary rod placement  EXAM: RIGHT FEMUR PORTABLE 1 VIEW  COMPARISON:  None.  FINDINGS: Medullary rod is noted. The proximal femoral fracture is reduced. A fixation screw is noted both proximally and distally. Heavy vascular calcifications are noted. No acute bony abnormality or soft tissue abnormality is seen.  IMPRESSION: Status post ORIF of proximal right femoral fracture.   Electronically Signed   By: Inez Catalina M.D.   On: 01/05/2015 10:10    Assessment/Plan: Day of Surgery   Active Problems:   Femur fracture  The patient's daughters in the ICU waiting room. Patient has been evaluated by Dr. Genia Harold from the hospitalist service. Patient will have continued respiratory support and cardiovascular monitoring in ICU.  Will continue to follow from orthopaedic standpoint.  Recommend judicious use of narcotics. Patient may restart Coumadin tonight.   Thornton Park , MD 01/05/2015, 12:44 PM

## 2015-01-06 ENCOUNTER — Inpatient Hospital Stay: Payer: Medicare Other

## 2015-01-06 LAB — TYPE AND SCREEN
ABO/RH(D): A POS
Antibody Screen: NEGATIVE
UNIT DIVISION: 0
Unit division: 0

## 2015-01-06 LAB — CBC
HEMATOCRIT: 29.9 % — AB (ref 40.0–52.0)
HEMOGLOBIN: 9.8 g/dL — AB (ref 13.0–18.0)
MCH: 29.3 pg (ref 26.0–34.0)
MCHC: 32.8 g/dL (ref 32.0–36.0)
MCV: 89.3 fL (ref 80.0–100.0)
Platelets: 202 10*3/uL (ref 150–440)
RBC: 3.35 MIL/uL — ABNORMAL LOW (ref 4.40–5.90)
RDW: 15.6 % — AB (ref 11.5–14.5)
WBC: 21.4 10*3/uL — AB (ref 3.8–10.6)

## 2015-01-06 LAB — BASIC METABOLIC PANEL
ANION GAP: 7 (ref 5–15)
BUN: 56 mg/dL — ABNORMAL HIGH (ref 6–20)
CALCIUM: 8.4 mg/dL — AB (ref 8.9–10.3)
CO2: 21 mmol/L — AB (ref 22–32)
CREATININE: 3.84 mg/dL — AB (ref 0.61–1.24)
Chloride: 112 mmol/L — ABNORMAL HIGH (ref 101–111)
GFR calc non Af Amer: 13 mL/min — ABNORMAL LOW (ref >60)
GFR, EST AFRICAN AMERICAN: 16 mL/min — AB (ref >60)
Glucose, Bld: 134 mg/dL — ABNORMAL HIGH (ref 65–99)
Potassium: 5.6 mmol/L — ABNORMAL HIGH (ref 3.5–5.1)
SODIUM: 140 mmol/L (ref 135–145)

## 2015-01-06 LAB — GLUCOSE, CAPILLARY
GLUCOSE-CAPILLARY: 118 mg/dL — AB (ref 65–99)
Glucose-Capillary: 113 mg/dL — ABNORMAL HIGH (ref 65–99)

## 2015-01-06 LAB — PROTIME-INR
INR: 1.07
PROTHROMBIN TIME: 14.1 s (ref 11.4–15.0)

## 2015-01-06 MED ORDER — HEPARIN SODIUM (PORCINE) 5000 UNIT/ML IJ SOLN
5000.0000 [IU] | Freq: Two times a day (BID) | INTRAMUSCULAR | Status: DC
  Administered 2015-01-06: 5000 [IU] via SUBCUTANEOUS
  Filled 2015-01-06: qty 1

## 2015-01-06 MED ORDER — ENOXAPARIN SODIUM 40 MG/0.4ML ~~LOC~~ SOLN: 40.0000 mg | SUBCUTANEOUS | Status: DC

## 2015-01-06 NOTE — Progress Notes (Signed)
Subjective: 1 Day Post-Op Procedure(s) (LRB): INTRAMEDULLARY (IM) NAIL FEMORAL (Right)     Nursing informs me that he is unable to take po meds now and thus cannot take warfarin.  He should be on Lovenox 40mg  daily anyway until protime returns to theraputic range.  Alternatively, xarelto or plavix would be acceptable alternatives when taking po well.  Objective:   VITALS:   Filed Vitals:   01/06/15 1600  BP: 149/65  Pulse: 45  Temp:   Resp: 20       LABS  Recent Labs  01/04/15 0235 01/05/15 0646 01/06/15 0353  HGB 8.1* 8.4* 9.8*  HCT 24.9* 25.7* 29.9*  WBC 5.6 9.9 21.4*  PLT 204 191 202     Recent Labs  01/04/15 0235 01/05/15 0646 01/06/15 0353  NA 142 140 140  K 5.6* 6.1* 5.6*  BUN 61* 52* 56*  CREATININE 3.77* 3.33* 3.84*  GLUCOSE 81 116* 134*     Recent Labs  01/05/15 1322 01/06/15 0353  INR 1.22 1.07     Assessment/Plan: 1 Day Post-Op Procedure(s) (LRB): INTRAMEDULLARY (IM) NAIL FEMORAL (Right)

## 2015-01-06 NOTE — Progress Notes (Signed)
Subjective: 1 Day Post-Op Procedure(s) (LRB): INTRAMEDULLARY (IM) NAIL FEMORAL (Right)    Patient reports pain as Unable to tell me.  sleeping.  Responds to movement with pain .  Objective:   VITALS:   Filed Vitals:   01/06/15 1300  BP: 108/67  Pulse: 54  Temp: 99.7 F (37.6 C)  Resp: 18    ABD soft Neurovascular intact Sensation intact distally Intact pulses distally No cellulitis present Compartment soft Dressings dry and minimal swelling  LABS  Recent Labs  01/04/15 0235 01/05/15 0646 01/06/15 0353  HGB 8.1* 8.4* 9.8*  HCT 24.9* 25.7* 29.9*  WBC 5.6 9.9 21.4*  PLT 204 191 202     Recent Labs  01/04/15 0235 01/05/15 0646 01/06/15 0353  NA 142 140 140  K 5.6* 6.1* 5.6*  BUN 61* 52* 56*  CREATININE 3.77* 3.33* 3.84*  GLUCOSE 81 116* 134*     Recent Labs  01/05/15 1322 01/06/15 0353  INR 1.22 1.07     Assessment/Plan: 1 Day Post-Op Procedure(s) (LRB): INTRAMEDULLARY (IM) NAIL FEMORAL (Right)   Up with therapy when better.  Remains in ICU for cardiac arrythmias.

## 2015-01-06 NOTE — Progress Notes (Signed)
Spoke with Dr. Sabra Heck on the phone about patient to receive anticoagulation with surgery yesterday. MD okayed anticoagulation with lovenox 40 mg daily (patient currently unable to swallow PO, does not follow commands and does not do any reflex attempts to swallow, would not wrap lips around straw let alone try to drink anything from it, stared blankly at RN). RN spoke with cardiologist (Dr. Nehemiah Massed) to see if that is sufficient anticoagulation for patient with aflutter and MD okayed the lovenox 40 mg daily for patient's aflutter anticoagulation.

## 2015-01-06 NOTE — Progress Notes (Signed)
Derek [name] is a 81 y.o. male   SUBJECTIVE: Patient suffered  fracturing left proximal femur.  Had surgery 8/26 but decompensated witth hypoxia, A flutter , requiring NRBM Transferred to step down.  CXR negative, but WBC spiked  Seen by cards- on dilt drip - HR down to 80s. BP stable. On 55% FiO2  ______________________________________________________________________  ROS: Review of systems is unremarkable for any active cardiac,respiratory, GI, GU, hematologic, neurologic or psychiatric systems, 10 systems reviewed.  . clonazePAM  0.5 mg Oral BID  . diltiazem  120 mg Oral BID  . diltiazem  10 mg Intravenous Once  . docusate sodium  100 mg Oral BID  . ferrous sulfate  325 mg Oral TID PC  . finasteride  5 mg Oral Daily  . furosemide  20 mg Oral Daily  . galantamine  8 mg Oral BID WC  . losartan  100 mg Oral Daily  . pantoprazole  40 mg Oral BID  . PARoxetine  10 mg Oral BH-q7a  . piperacillin-tazobactam (ZOSYN)  IV  3.375 g Intravenous Q12H  . sodium chloride  3 mL Intravenous Q12H  . tamsulosin  0.4 mg Oral Daily  . vancomycin  1,000 mg Intravenous Q48H  . warfarin  1.5 mg Oral Q M,W,F-1800   And  . warfarin  3 mg Oral Q T,Th,S,Su-1800  . Warfarin - Pharmacist Dosing Inpatient   Does not apply q1800   acetaminophen, alum & mag hydroxide-simeth, bisacodyl, HYDROcodone-acetaminophen, magnesium citrate, menthol-cetylpyridinium **OR** phenol, morphine injection, ondansetron **OR** ondansetron (ZOFRAN) IV, polyethylene glycol   Past Medical History  Diagnosis Date  . Undiagnosed cardiac murmurs   . CAD (coronary artery disease)   . Other and unspecified hyperlipidemia   . Unspecified essential hypertension   . Diaphragmatic hernia without mention of obstruction or gangrene   . Sensory hearing loss, unilateral   . Colon polyps   . Impaired fasting glucose   . Esophageal reflux   . Elevated prostate specific antigen (PSA)   . Unspecified disorder resulting from impaired renal  function   . Unspecified disorder of kidney and ureter   . Alzheimer's dementia     Past Surgical History  Procedure Laterality Date  . Polyp removal  1991  . Hop r/o  1994    HTN past mild MI Dr. Gwenlyn Found  . Stress cardiolite  4/99    nml. Dr. Percival Spanish. no change when done 02/06/03  . Colonoscopy  10/99    no polyps. Dr. Vira Agar  . Esophagogastroduodenoscopy  08/15/02    hh erosions of stomah. Dr. Vira Agar  . Hernia repair  05/11/03  . Esophagogastroduodenoscopy  08/15/02    hh erosive gastropathy- duodenal mass   . Cystoscopy  5/07     thickening of bladder wall; other wise wnl    PHYSICAL EXAM:  BP 129/94 mmHg  Pulse 139  Temp(Src) 96.9 F (36.1 C) (Axillary)  Resp 19  Ht 5\' 6"  (1.676 m)  Wt 60.9 kg (134 lb 4.2 oz)  BMI 21.68 kg/m2  SpO2 97%  Wt Readings from Last 3 Encounters:  01/06/15 60.9 kg (134 lb 4.2 oz)  11/21/14 65.772 kg (145 lb)  11/03/14 58.968 kg (130 lb)           BP Readings from Last 3 Encounters:  01/06/15 129/94  11/22/14 185/99  11/04/14 166/67   Constitutional: ill appearing Neck: supple, no thyromegaly Respiratory: bilateral rhonchi Cardiovascular: IRR, tachy Abdomen: soft, nml BS, nontender Extremities: no edema, moderate right anterior tenderness Neuro: lethargic  ASSESSMENT/PLAN: Resp failure- likley aspiration pna vs pulm edema from tachycardia - started abx vanco and zosyn for HCAP and aspiration -  - pulm toilet  - Repeat CXR  A flutter with RVR - on dilt drip- HR much improved -  Cardiology help appreciated Gentle diuresis - got lasix in PACU 8/26  Holding coumadin   Femoral fracture-orthopedic evaluation - s/p surgery  holding Coumadin but may restart once stable per cards and ortho  Progressive aphasia-associated dementia associated with delirium, galantamine and twice a day Klonopin  CKD4-baseline creatinine 3.7 with associated anemia Overall poor prognosis, will need skilled nursing Pt is DNR

## 2015-01-06 NOTE — Progress Notes (Signed)
[facility] Encounter Note  Patient: Derek [name] / Admit Date: 01/04/2015 / Date of Encounter: 01/06/2015, 11:45 AM   Subjective: Atrial flutter with continued rapid ventricular rate otherwise hemodynamically stable  Review of Systems:  Not able to assess Objective: Telemetry: Atrial flutter with rapid ventricular rate Physical Exam: Blood pressure 129/94, pulse 139, temperature 96.9 F (36.1 C), temperature source Axillary, resp. rate 19, height 5\' 6"  (1.676 m), weight 134 lb 4.2 oz (60.9 kg), SpO2 97 %. Body mass index is 21.68 kg/(m^2). General: Well developed, well nourished, in no acute distress. Head: Normocephalic, atraumatic, sclera non-icteric, no xanthomas, nares are without discharge. Neck: No apparent masses Lungs: Elevated respirations with moderate wheezes, no rhonchi, no rales , basilar crackles   Heart: Rapid rate and rhythm, normal S1 S2, apical murmur, no rub, no gallop, PMI is normal size and placement, carotid upstroke normal without bruit, jugular venous pressure normal Abdomen: Soft, non-tender, non-distended with normoactive bowel sounds. No hepatosplenomegaly. Abdominal aorta is normal size without bruit Extremities: Trace to 1+ edema, no clubbing, no cyanosis, no ulcers,  Peripheral: 2+ radial, 2+ femoral, 2+ dorsal pedal pulses Neuro: Alert and oriented. Moves all extremities spontaneously.    Intake/Output Summary (Last 24 hours) at 01/06/15 1145 Last data filed at 01/06/15 0734  Gross per 24 hour  Intake  739.5 ml  Output   1600 ml  Net -860.5 ml    Inpatient Medications:  . clonazePAM  0.5 mg Oral BID  . diltiazem  120 mg Oral BID  . diltiazem  10 mg Intravenous Once  . docusate sodium  100 mg Oral BID  . ferrous sulfate  325 mg Oral TID PC  . finasteride  5 mg Oral Daily  . furosemide  20 mg Oral Daily  . galantamine  8 mg Oral BID WC  . losartan  100 mg Oral Daily  . pantoprazole  40 mg Oral BID  . PARoxetine  10 mg  Oral BH-q7a  . piperacillin-tazobactam (ZOSYN)  IV  3.375 g Intravenous Q12H  . sodium chloride  3 mL Intravenous Q12H  . tamsulosin  0.4 mg Oral Daily  . vancomycin  1,000 mg Intravenous Q48H  . warfarin  1.5 mg Oral Q M,W,F-1800   And  . warfarin  3 mg Oral Q T,Th,S,Su-1800  . Warfarin - Pharmacist Dosing Inpatient   Does not apply q1800   Infusions:  . diltiazem (CARDIZEM) infusion 15 mg/hr (01/06/15 0601)    Labs:  Recent Labs  01/05/15 0646 01/06/15 0353  NA 140 140  K 6.1* 5.6*  CL 116* 112*  CO2 21* 21*  GLUCOSE 116* 134*  BUN 52* 56*  CREATININE 3.33* 3.84*  CALCIUM 8.2* 8.4*    Recent Labs  01/04/15 0235  AST 19  ALT 13*  ALKPHOS 52  BILITOT 0.4  PROT 6.0*  ALBUMIN 3.2*    Recent Labs  01/04/15 0235 01/05/15 0646 01/06/15 0353  WBC 5.6 9.9 21.4*  NEUTROABS 4.1 8.4*  --   HGB 8.1* 8.4* 9.8*  HCT 24.9* 25.7* 29.9*  MCV 92.7 93.3 89.3  PLT 204 191 202    Recent Labs  01/04/15 0235  TROPONINI <0.03   Invalid input(s): POCBNP  Recent Labs  01/04/15 0235  HGBA1C 5.9     Weights: Filed Weights   01/04/15 0505 01/05/15 0537 01/06/15 0455  Weight: 136 lb 6.4 oz (61.871 kg) 143 lb 4.8 oz (65 kg) 134 lb 4.2 oz (60.9 kg)  Radiology/Studies:  Dg Chest 1 View  01/05/2015   CLINICAL DATA:  Increasing shortness of Breath following femoral rod placement  EXAM: CHEST  1 VIEW  COMPARISON:  01/04/2015  FINDINGS: Cardiac shadow is within normal limits. Postoperative changes are again seen. Aortic calcifications are again noted. The lungs are well aerated bilaterally with some chronic interstitial change. No focal abnormality is seen.  IMPRESSION: No active disease.   Electronically Signed   By: Inez Catalina M.D.   On: 01/05/2015 11:45   Dg Chest 1 View  01/04/2015   CLINICAL DATA:  Right hip and left hip pain. Unwitnessed fall. Patient on Coumadin. Preoperative study.  EXAM: CHEST  1 VIEW  COMPARISON:  11/03/2014  FINDINGS: Postoperative changes  in the mediastinum. Heart size and pulmonary vascularity are normal. No focal airspace disease or consolidation. No pneumothorax. Calcified and tortuous aorta.  IMPRESSION: No active disease.   Electronically Signed   By: Lucienne Capers M.D.   On: 01/04/2015 03:12   Ct Head Wo Contrast  01/04/2015   CLINICAL DATA:  Fall with loss of consciousness. Slip on hardwood floor. On Coumadin with history of dementia.  EXAM: CT HEAD WITHOUT CONTRAST  TECHNIQUE: Contiguous axial images were obtained from the base of the skull through the vertex without intravenous contrast.  COMPARISON:  11/21/2014  FINDINGS: Unchanged cerebral atrophy. Unchanged chronic small vessel ischemic change.No intracranial hemorrhage, mass effect, or midline shift. No hydrocephalus. The basilar cisterns are patent. No evidence of territorial infarct. No intracranial fluid collection. Calvarium is intact. Included paranasal sinuses and mastoid air cells are well aerated.  IMPRESSION: Stable atrophy and chronic small vessel ischemic change without acute intracranial abnormality.   Electronically Signed   By: Jeb Levering M.D.   On: 01/04/2015 02:55   Dg Hip Operative Unilat With Pelvis Right  01/05/2015   CLINICAL DATA:  Elective surgery.  EXAM: OPERATIVE right HIP (WITH PELVIS IF PERFORMED) 2 VIEWS  TECHNIQUE: Fluoroscopic spot image(s) were submitted for interpretation post-operatively.  COMPARISON:  Pelvis radiography 01/04/2015  FINDINGS: Intraoperative fluoroscopy shows placement of a femoral nail with dynamic femoral neck screw for treatment of a intertrochanteric and subtrochanteric right femur fracture. No evidence of intraoperative fracture or dislocation.  IMPRESSION: Fluoroscopy for proximal femur fracture fixation.   Electronically Signed   By: Monte Fantasia M.D.   On: 01/05/2015 09:30   Dg Hip Unilat With Pelvis 2-3 Views Right  01/04/2015   CLINICAL DATA:  Right-sided hip pain after slip and fall.  EXAM: DG HIP (WITH OR  WITHOUT PELVIS) 2-3V RIGHT  COMPARISON:  None.  FINDINGS: Primarily oblique fracture of the right proximal femur with intertrochanteric and subtrochanteric components. Minimal displacement. No significant angulation. Remainder the bony pelvis and left hip are intact. There are dense atherosclerotic calcifications of the pelvic vasculature. Tacks from bilateral hernia repairs noted.  IMPRESSION: Right proximal femur fracture with mild displacement involving the intertrochanteric and subtrochanteric femur.   Electronically Signed   By: Jeb Levering M.D.   On: 01/04/2015 03:13   Dg Femur Port, Min 2 Views Right  01/05/2015   CLINICAL DATA:  Status post medullary rod placement  EXAM: RIGHT FEMUR PORTABLE 1 VIEW  COMPARISON:  None.  FINDINGS: Medullary rod is noted. The proximal femoral fracture is reduced. A fixation screw is noted both proximally and distally. Heavy vascular calcifications are noted. No acute bony abnormality or soft tissue abnormality is seen.  IMPRESSION: Status post ORIF of proximal right femoral fracture.   Electronically  Signed   By: Inez Catalina M.D.   On: 01/05/2015 10:10     Assessment and Recommendation  81 y.o. male with acute respiratory failure likely secondary to aspiration and/or hypoxia with a syncopal episode and femur fracture and known coronary artery disease without evidence of current myocardial infarction but continued atrial flutter with rapid ventricular rate on reasonable control with diltiazem drip but unable to take oral 2 lower heart rate 1. Continue diltiazem drip at maximum for heart rate control X 2. Anticoagulation if able if not causing anemia to reduce risk of stroke and deep venous thrombosis 3. Continue aggressive pulmonary treatment and care which appears to be the primary issue at this time 4. Possible addition of oral medication management for heart rate control and/or use of amiodarone if patient continues to have significant high heart rate and  begins to affect his overall outcome  Signed, Serafina Royals M.D. FACC

## 2015-01-06 NOTE — Progress Notes (Signed)
ANTICOAGULATION CONSULT NOTE - Initial Consult  Pharmacy Consult for Heparin/Lovenox Indication: DVT  Allergies  Allergen Reactions  . Pradaxa [Dabigatran Etexilate Mesylate] Other (See Comments)    Patient Measurements: Height: 5\' 6"  (167.6 cm) Weight: 134 lb 4.2 oz (60.9 kg) IBW/kg (Calculated) : 63.8 Heparin Dosing Weight:   Vital Signs: Temp: 99.2 F (37.3 C) (08/27 1552) Temp Source: Axillary (08/27 1552) BP: 149/65 mmHg (08/27 1600) Pulse Rate: 45 (08/27 1600)  Labs:  Recent Labs  01/04/15 0235  01/05/15 0646 01/05/15 1322 01/06/15 0353  HGB 8.1*  --  8.4*  --  9.8*  HCT 24.9*  --  25.7*  --  29.9*  PLT 204  --  191  --  202  LABPROT 28.9*  < > 14.9 15.6* 14.1  INR 2.72  < > 1.15 1.22 1.07  CREATININE 3.77*  --  3.33*  --  3.84*  TROPONINI <0.03  --   --   --   --   < > = values in this interval not displayed.  Estimated Creatinine Clearance: 13 mL/min (by C-G formula based on Cr of 3.84).   Medical History: Past Medical History  Diagnosis Date  . Undiagnosed cardiac murmurs   . CAD (coronary artery disease)   . Other and unspecified hyperlipidemia   . Unspecified essential hypertension   . Diaphragmatic hernia without mention of obstruction or gangrene   . Sensory hearing loss, unilateral   . Colon polyps   . Impaired fasting glucose   . Esophageal reflux   . Elevated prostate specific antigen (PSA)   . Unspecified disorder resulting from impaired renal function   . Unspecified disorder of kidney and ureter   . Alzheimer's dementia     Medications:  Prescriptions prior to admission  Medication Sig Dispense Refill Last Dose  . acetaminophen (TYLENOL) 500 MG tablet Take 1,000 mg by mouth every 6 (six) hours as needed for mild pain or headache.   01/03/2015 at Unknown time  . clonazePAM (KLONOPIN) 0.5 MG tablet Take 1 tablet by mouth 2 (two) times daily.   01/03/2015 at Unknown time  . diltiazem (CARDIZEM CD) 120 MG 24 hr capsule Take 120 mg by  mouth 2 (two) times daily.   01/03/2015 at Unknown time  . finasteride (PROSCAR) 5 MG tablet Take 5 mg by mouth daily.   01/03/2015 at Unknown time  . furosemide (LASIX) 20 MG tablet Take 20 mg by mouth daily.   01/03/2015 at Unknown time  . galantamine (RAZADYNE) 8 MG tablet Take 8 mg by mouth 2 (two) times daily with a meal.   01/03/2015 at Unknown time  . losartan (COZAAR) 100 MG tablet Take 100 mg by mouth daily.   01/03/2015 at Unknown time  . pantoprazole (PROTONIX) 40 MG tablet Take 40 mg by mouth 2 (two) times daily.   01/03/2015 at Unknown time  . tamsulosin (FLOMAX) 0.4 MG CAPS capsule Take 0.4 mg by mouth daily.   01/03/2015 at Unknown time  . warfarin (COUMADIN) 3 MG tablet Take 1.5-3 mg by mouth at bedtime. 0.5 tablet on Monday, Wednesday, and Friday; 1 tablet on Tuesday, Thursday, Saturday, and Sunday   01/03/2015 at Unknown time  . citalopram (CELEXA) 20 MG tablet Take 1 tablet by mouth daily.  5 Not Taking at Unknown time  . PARoxetine (PAXIL) 10 MG tablet Take 10 mg by mouth every morning.   Not Taking at Unknown time    Assessment: Pt previously on warfarin but sub-therapeutic INR.  Pt no longer able to take PO. CrCl = 13 ml/min.   Goal of Therapy:  DVT prophylaxis   Plan:  Lovenox 40 mg SQ Q24H originally ordered. Will d/c lovenox and start Heparin 5000 units SQ Q12H based on CrCl < 20 ml/min.   Derek [name] D 01/06/2015,4:54 PM

## 2015-01-06 NOTE — Progress Notes (Signed)
OT Cancellation Note  Patient Details Name: Derek [name] MRN: [id number] DOB: November 05, 1930   Cancelled Treatment:    Reason Eval/Treat Not Completed: Patient not medically ready (per NSG patient not alert or following any commands this morning. Will evaluate when patient appropriate)  Rakeb Kibble L  Vickii Volland L, OT 01/06/2015, 10:21 AM

## 2015-01-06 NOTE — Progress Notes (Signed)
ANTICOAGULATION CONSULT NOTE - Initial Consult  Pharmacy Consult for Coumadin Indication: atrial fibrillation  Allergies  Allergen Reactions  . Pradaxa [Dabigatran Etexilate Mesylate] Other (See Comments)    Patient Measurements: Height: 5\' 6"  (167.6 cm) Weight: 134 lb 4.2 oz (60.9 kg) IBW/kg (Calculated) : 63.8   Vital Signs: Temp: 96.9 F (36.1 C) (08/27 0748) Temp Source: Axillary (08/27 0748) BP: 129/94 mmHg (08/27 1000) Pulse Rate: 139 (08/27 1039)  Labs:  Recent Labs  01/04/15 0235  01/05/15 0646 01/05/15 1322 01/06/15 0353  HGB 8.1*  --  8.4*  --  9.8*  HCT 24.9*  --  25.7*  --  29.9*  PLT 204  --  191  --  202  LABPROT 28.9*  < > 14.9 15.6* 14.1  INR 2.72  < > 1.15 1.22 1.07  CREATININE 3.77*  --  3.33*  --  3.84*  TROPONINI <0.03  --   --   --   --   < > = values in this interval not displayed.  Estimated Creatinine Clearance: 13 mL/min (by C-G formula based on Cr of 3.84).   Medical History: Past Medical History  Diagnosis Date  . Undiagnosed cardiac murmurs   . CAD (coronary artery disease)   . Other and unspecified hyperlipidemia   . Unspecified essential hypertension   . Diaphragmatic hernia without mention of obstruction or gangrene   . Sensory hearing loss, unilateral   . Colon polyps   . Impaired fasting glucose   . Esophageal reflux   . Elevated prostate specific antigen (PSA)   . Unspecified disorder resulting from impaired renal function   . Unspecified disorder of kidney and ureter   . Alzheimer's dementia     Medications:  Prescriptions prior to admission  Medication Sig Dispense Refill Last Dose  . acetaminophen (TYLENOL) 500 MG tablet Take 1,000 mg by mouth every 6 (six) hours as needed for mild pain or headache.   01/03/2015 at Unknown time  . clonazePAM (KLONOPIN) 0.5 MG tablet Take 1 tablet by mouth 2 (two) times daily.   01/03/2015 at Unknown time  . diltiazem (CARDIZEM CD) 120 MG 24 hr capsule Take 120 mg by mouth 2 (two)  times daily.   01/03/2015 at Unknown time  . finasteride (PROSCAR) 5 MG tablet Take 5 mg by mouth daily.   01/03/2015 at Unknown time  . furosemide (LASIX) 20 MG tablet Take 20 mg by mouth daily.   01/03/2015 at Unknown time  . galantamine (RAZADYNE) 8 MG tablet Take 8 mg by mouth 2 (two) times daily with a meal.   01/03/2015 at Unknown time  . losartan (COZAAR) 100 MG tablet Take 100 mg by mouth daily.   01/03/2015 at Unknown time  . pantoprazole (PROTONIX) 40 MG tablet Take 40 mg by mouth 2 (two) times daily.   01/03/2015 at Unknown time  . tamsulosin (FLOMAX) 0.4 MG CAPS capsule Take 0.4 mg by mouth daily.   01/03/2015 at Unknown time  . warfarin (COUMADIN) 3 MG tablet Take 1.5-3 mg by mouth at bedtime. 0.5 tablet on Monday, Wednesday, and Friday; 1 tablet on Tuesday, Thursday, Saturday, and Sunday   01/03/2015 at Unknown time  . citalopram (CELEXA) 20 MG tablet Take 1 tablet by mouth daily.  5 Not Taking at Unknown time  . PARoxetine (PAXIL) 10 MG tablet Take 10 mg by mouth every morning.   Not Taking at Unknown time   Scheduled:  . clonazePAM  0.5 mg Oral BID  . diltiazem  120 mg Oral BID  . diltiazem  10 mg Intravenous Once  . docusate sodium  100 mg Oral BID  . ferrous sulfate  325 mg Oral TID PC  . finasteride  5 mg Oral Daily  . furosemide  20 mg Oral Daily  . galantamine  8 mg Oral BID WC  . losartan  100 mg Oral Daily  . pantoprazole  40 mg Oral BID  . PARoxetine  10 mg Oral BH-q7a  . piperacillin-tazobactam (ZOSYN)  IV  3.375 g Intravenous Q12H  . sodium chloride  3 mL Intravenous Q12H  . tamsulosin  0.4 mg Oral Daily  . vancomycin  1,000 mg Intravenous Q48H  . warfarin  1.5 mg Oral Q M,W,F-1800   And  . warfarin  3 mg Oral Q T,Th,S,Su-1800  . Warfarin - Pharmacist Dosing Inpatient   Does not apply q1800   Infusions:  . diltiazem (CARDIZEM) infusion 15 mg/hr (01/06/15 0601)   PRN: acetaminophen, alum & mag hydroxide-simeth, bisacodyl, HYDROcodone-acetaminophen, magnesium  citrate, menthol-cetylpyridinium **OR** phenol, morphine injection, ondansetron **OR** ondansetron (ZOFRAN) IV, polyethylene glycol  Assessment: 81 y/o M on warfarin PTA admitted for hip fracture repair developing aflutter after surgery. INR was therapeutic on admission on home dose of Coumadin 1.5 mg MWF and 3 mg TThSaSu. Patient received phytonadione 10 mg iv x 2 prior to surgery.   8/27 INR = 1.07.  Goal of Therapy:  INR 2-3  Plan:  Will continue warfarin outpatient dosing of Coumadin 1.5 mg MWF and 3 mg TThSaSu and f/u AM INR.   Luciano Cinquemani K 01/06/2015,11:54 AM

## 2015-01-06 NOTE — Progress Notes (Signed)
PT Cancellation Note  Patient Details Name: Derek [name] MRN: [id number] DOB: 1930-10-16   Cancelled Treatment:    Reason Eval/Treat Not Completed: Patient not medically ready Spoke with nursing earlier and again this afternoon.  Pt has been minimally awake/aware and generally is not appropriate for PT at this time.  Will check back in tomorrow to see if he is appropriate to start PT.   [name], PT, DPT [phone]  [name] 01/06/2015, 1:16 PM

## 2015-01-07 LAB — BASIC METABOLIC PANEL
ANION GAP: 7 (ref 5–15)
BUN: 69 mg/dL — ABNORMAL HIGH (ref 6–20)
CO2: 21 mmol/L — AB (ref 22–32)
Calcium: 8.4 mg/dL — ABNORMAL LOW (ref 8.9–10.3)
Chloride: 115 mmol/L — ABNORMAL HIGH (ref 101–111)
Creatinine, Ser: 4.41 mg/dL — ABNORMAL HIGH (ref 0.61–1.24)
GFR, EST AFRICAN AMERICAN: 13 mL/min — AB (ref >60)
GFR, EST NON AFRICAN AMERICAN: 11 mL/min — AB (ref >60)
GLUCOSE: 120 mg/dL — AB (ref 65–99)
POTASSIUM: 5.3 mmol/L — AB (ref 3.5–5.1)
Sodium: 143 mmol/L (ref 135–145)

## 2015-01-07 LAB — CBC
HEMATOCRIT: 25.5 % — AB (ref 40.0–52.0)
Hemoglobin: 8.3 g/dL — ABNORMAL LOW (ref 13.0–18.0)
MCH: 29.4 pg (ref 26.0–34.0)
MCHC: 32.8 g/dL (ref 32.0–36.0)
MCV: 89.6 fL (ref 80.0–100.0)
Platelets: 171 10*3/uL (ref 150–440)
RBC: 2.84 MIL/uL — AB (ref 4.40–5.90)
RDW: 15.6 % — ABNORMAL HIGH (ref 11.5–14.5)
WBC: 14 10*3/uL — AB (ref 3.8–10.6)

## 2015-01-07 LAB — PROTIME-INR
INR: 1.19
Prothrombin Time: 15.3 seconds — ABNORMAL HIGH (ref 11.4–15.0)

## 2015-01-07 MED ORDER — DILTIAZEM LOAD VIA INFUSION
20.0000 mg | Freq: Once | INTRAVENOUS | Status: AC
  Administered 2015-01-07: 20 mg via INTRAVENOUS
  Filled 2015-01-07: qty 20

## 2015-01-07 MED ORDER — ENOXAPARIN SODIUM 40 MG/0.4ML ~~LOC~~ SOLN: 40.0000 mg | SUBCUTANEOUS | Status: DC

## 2015-01-07 MED ORDER — SODIUM CHLORIDE 0.9 % IV SOLN: INTRAVENOUS | Status: DC

## 2015-01-07 MED ORDER — SODIUM CHLORIDE 0.45 % IV SOLN
INTRAVENOUS | Status: DC
  Administered 2015-01-07 – 2015-01-10 (×6): via INTRAVENOUS

## 2015-01-07 MED ORDER — ENOXAPARIN SODIUM 30 MG/0.3ML ~~LOC~~ SOLN
30.0000 mg | SUBCUTANEOUS | Status: DC
  Administered 2015-01-07: 30 mg via SUBCUTANEOUS
  Filled 2015-01-07: qty 0.3

## 2015-01-07 MED ORDER — INFLUENZA VAC SPLIT QUAD 0.5 ML IM SUSY: 0.5000 mL | PREFILLED_SYRINGE | INTRAMUSCULAR | Status: DC

## 2015-01-07 MED ORDER — HALOPERIDOL LACTATE 5 MG/ML IJ SOLN
1.0000 mg | INTRAMUSCULAR | Status: DC | PRN
Start: 2015-01-07 — End: 2015-01-08
  Administered 2015-01-07: 1 mg via INTRAVENOUS
  Administered 2015-01-08: 2 mg via INTRAVENOUS
  Administered 2015-01-08: 1 mg via INTRAVENOUS
  Filled 2015-01-07 (×3): qty 1

## 2015-01-07 NOTE — Progress Notes (Signed)
Subjective:  Patient known to our practice from 2014. He was followed by Dr. Juleen China for chronic kidney disease stage IV. Apparently, he transferred his care to [facility] in 2014. Patient presented with femur fracture status post fall  Post surgery, in PACU, patient was noted to be hypoxic and in atrial flutter with heart rate of 120-130 and increased work of breathing. He is currently diagnosed with aspiration pneumonia and is being treated for that Post surgery, his creatinine has been worsening. His baseline creatinine appears to be 3.7 on August 12/GFR of 16 which is at the time of admission Today's creatinine is 4.41 Urine output has decreased over the last 3 days from 2200 cc down to 1300 cc last 24 hours Patient was given IV Lasix on August 25 and 26  At present, he is critically ill, requiring nonrebreather mask for oxygen supplementation   Objective:  Vital signs in last 24 hours:  Temp:  [98.6 F (37 C)-99.7 F (37.6 C)] 98.6 F (37 C) (08/28 0000) Pulse Rate:  [36-141] 88 (08/28 0600) Resp:  [16-28] 16 (08/28 0600) BP: (106-157)/(58-102) 147/71 mmHg (08/28 0600) SpO2:  [86 %-98 %] 98 % (08/28 0600) FiO2 (%):  [55 %-100 %] 55 % (08/28 0104) Weight:  [60.7 kg (133 lb 13.1 oz)] 60.7 kg (133 lb 13.1 oz) (08/28 0500)  Weight change: -0.2 kg (-7.1 oz) Filed Weights   01/05/15 0537 01/06/15 0455 01/07/15 0500  Weight: 65 kg (143 lb 4.8 oz) 60.9 kg (134 lb 4.2 oz) 60.7 kg (133 lb 13.1 oz)    Intake/Output:    Intake/Output Summary (Last 24 hours) at 01/07/15 0931 Last data filed at 01/07/15 1027  Gross per 24 hour  Intake    450 ml  Output   1150 ml  Net   -700 ml     Physical Exam: General:  critically ill-appearing, frail, elderly, restless  HEENT  dry oral mucous membranes, anicteric, face mask for oxygen  Neck  supple  Pulm/lungs  clear anteriorly and laterally, normal effort  CVS/Heart  regular rhythm, sinus, no rub or gallop  Abdomen:   soft, nontender,  nondistended  Extremities:  no peripheral edema, right hip surgical bandage  Neurologic:  alert, not following commands, delirious  Skin:  no acute rashes  Access:     Foley present    Basic Metabolic Panel:   Recent Labs Lab 01/04/15 0235 01/05/15 0646 01/06/15 0353 01/07/15 0442  NA 142 140 140 143  K 5.6* 6.1* 5.6* 5.3*  CL 120* 116* 112* 115*  CO2 22 21* 21* 21*  GLUCOSE 81 116* 134* 120*  BUN 61* 52* 56* 69*  CREATININE 3.77* 3.33* 3.84* 4.41*  CALCIUM 8.2* 8.2* 8.4* 8.4*     CBC:  Recent Labs Lab 01/04/15 0235 01/05/15 0646 01/06/15 0353 01/07/15 0442  WBC 5.6 9.9 21.4* 14.0*  NEUTROABS 4.1 8.4*  --   --   HGB 8.1* 8.4* 9.8* 8.3*  HCT 24.9* 25.7* 29.9* 25.5*  MCV 92.7 93.3 89.3 89.6  PLT 204 191 202 171      Microbiology:  Recent Results (from the past 720 hour(s))  Surgical pcr screen     Status: None   Collection Time: 01/04/15 10:58 AM  Result Value Ref Range Status   MRSA, PCR NEGATIVE NEGATIVE Final   Staphylococcus aureus NEGATIVE NEGATIVE Final    Comment:        The Xpert SA Assay (FDA approved for NASAL specimens in patients over 80 years of age),  is one component of a comprehensive surveillance program.  Test performance has been validated by [facility] for patients greater than or equal to 19 year old. It is not intended to diagnose infection nor to guide or monitor treatment.   MRSA PCR Screening     Status: None   Collection Time: 01/05/15  1:55 PM  Result Value Ref Range Status   MRSA by PCR NEGATIVE NEGATIVE Final    Comment:        The GeneXpert MRSA Assay (FDA approved for NASAL specimens only), is one component of a comprehensive MRSA colonization surveillance program. It is not intended to diagnose MRSA infection nor to guide or monitor treatment for MRSA infections.     Coagulation Studies:  Recent Labs  01/04/15 1650 01/05/15 0646 01/05/15 1322 01/06/15 0353 01/07/15 0442  LABPROT 22.9* 14.9 15.6*  14.1 15.3*  INR 2.01 1.15 1.22 1.07 1.19    Urinalysis: No results for input(s): COLORURINE, LABSPEC, PHURINE, GLUCOSEU, HGBUR, BILIRUBINUR, KETONESUR, PROTEINUR, UROBILINOGEN, NITRITE, LEUKOCYTESUR in the last 72 hours.  Invalid input(s): APPERANCEUR    Imaging: Dg Chest 1 View  01/05/2015   CLINICAL DATA:  Increasing shortness of Breath following femoral rod placement  EXAM: CHEST  1 VIEW  COMPARISON:  01/04/2015  FINDINGS: Cardiac shadow is within normal limits. Postoperative changes are again seen. Aortic calcifications are again noted. The lungs are well aerated bilaterally with some chronic interstitial change. No focal abnormality is seen.  IMPRESSION: No active disease.   Electronically Signed   By: Inez Catalina M.D.   On: 01/05/2015 11:45   Dg Chest Port 1 View  01/06/2015   CLINICAL DATA:  81 year old male with acute respiratory failure. History of coronary artery disease.  EXAM: PORTABLE CHEST - 1 VIEW  COMPARISON:  Chest x-ray 01/05/2015.  FINDINGS: Lung volumes are slightly low. Airspace consolidation in the right lower lobe. Small right pleural effusion. Mild patchy interstitial prominence in the lungs bilaterally, similar to prior examinations, favored to reflect areas of scarring. Emphysematous changes. No evidence of pulmonary edema. Heart size is borderline enlarged upper mediastinal contours are within normal limits. Atherosclerosis in the thoracic aorta. Status post median sternotomy.  IMPRESSION: 1. Right lower lobe consolidation concerning for pneumonia, with small right parapneumonic pleural effusion. 2. Chronic lung changes, including emphysema, redemonstrated, as above. 3. Atherosclerosis.   Electronically Signed   By: Vinnie Langton M.D.   On: 01/06/2015 13:55   Dg Femur Port, Min 2 Views Right  01/05/2015   CLINICAL DATA:  Status post medullary rod placement  EXAM: RIGHT FEMUR PORTABLE 1 VIEW  COMPARISON:  None.  FINDINGS: Medullary rod is noted. The proximal femoral  fracture is reduced. A fixation screw is noted both proximally and distally. Heavy vascular calcifications are noted. No acute bony abnormality or soft tissue abnormality is seen.  IMPRESSION: Status post ORIF of proximal right femoral fracture.   Electronically Signed   By: Inez Catalina M.D.   On: 01/05/2015 10:10     Medications:   . sodium chloride    . diltiazem (CARDIZEM) infusion 15 mg/hr (01/07/15 0343)   . clonazePAM  0.5 mg Oral BID  . diltiazem  120 mg Oral BID  . docusate sodium  100 mg Oral BID  . enoxaparin (LOVENOX) injection  30 mg Subcutaneous Q24H  . ferrous sulfate  325 mg Oral TID PC  . finasteride  5 mg Oral Daily  . galantamine  8 mg Oral BID WC  . pantoprazole  40 mg Oral BID  . PARoxetine  10 mg Oral BH-q7a  . piperacillin-tazobactam (ZOSYN)  IV  3.375 g Intravenous Q12H  . sodium chloride  3 mL Intravenous Q12H  . tamsulosin  0.4 mg Oral Daily  . vancomycin  1,000 mg Intravenous Q48H   acetaminophen, alum & mag hydroxide-simeth, bisacodyl, HYDROcodone-acetaminophen, magnesium citrate, menthol-cetylpyridinium **OR** phenol, morphine injection, ondansetron **OR** ondansetron (ZOFRAN) IV, polyethylene glycol  Assessment/ Plan:  81 y.o. male with past medical history of anemia, atrial fibrillation, hypertension, hyperlipidemia, BPH, Aortic stenosis and coronary artery disease. Hospital course was complicated by aspiration pneumonia and acute kidney injury.  1. Acute renal failure. Likely ATN from hemodynamic instability and aspiration pneumonia Clinically, patient appears somewhat dry at this time He was getting IV Lasix previously 2. Aspiration pneumonia Oxygen supplementation Broad-spectrum antibiotics - Zosyn, vancomycin 3. Chronic kidney disease stage IV. Baseline creatinine 3.7/GFR 16  Plan: will provide low-dose IV fluid supplementation with half normal saline Avoid hypotension Dose medications for creatinine clearance less than 30 Avoid magnesium  containing laxatives. Agree with MiraLAX Avoid nephrotoxins including IV contrast, nonsteroidals At present, electrolytes and volume status are acceptable. There is no acute indication for dialysis but due to frailty, patient would not make a good dialysis candidate.     LOS: [facility] [place] 8/28/20169:31 AM

## 2015-01-07 NOTE — Progress Notes (Signed)
Subjective: 2 Days Post-Op Procedure(s) (LRB): INTRAMEDULLARY (IM) NAIL FEMORAL (Right)    Patient reports pain as Not verbal at present.  Has been stable.  Lovenox restarted yesterday.  Objective:   VITALS:   Filed Vitals:   01/07/15 1100  BP: 149/83  Pulse: 84  Temp:   Resp: 16    Neurovascular intact Sensation intact distally Intact pulses distally Dorsiflexion/Plantar flexion intact No cellulitis present Compartment soft Awakens to rom of right hip and leg.  No swelling distally. Dressings dry  LABS  Recent Labs  01/05/15 0646 01/06/15 0353 01/07/15 0442  HGB 8.4* 9.8* 8.3*  HCT 25.7* 29.9* 25.5*  WBC 9.9 21.4* 14.0*  PLT 191 202 171     Recent Labs  01/05/15 0646 01/06/15 0353 01/07/15 0442  NA 140 140 143  K 6.1* 5.6* 5.3*  BUN 52* 56* 69*  CREATININE 3.33* 3.84* 4.41*  GLUCOSE 116* 134* 120*     Recent Labs  01/06/15 0353 01/07/15 0442  INR 1.07 1.19     Assessment/Plan: 2 Days Post-Op Procedure(s) (LRB): INTRAMEDULLARY (IM) NAIL FEMORAL (Right)   Up with therapy when more alert

## 2015-01-07 NOTE — Progress Notes (Signed)
RN spoke with MD on call (Dr. Ola Spurr) on the phone. Patient more agitated and restless since around 16:00. Pulling off oxygen mask and heart monitor leads, trying to hit staff when they replace them. RN gave pain medication around 16:00 which had some improvement but patient returned to be restless and agitated. MD ordered 1-2 mg of haldol IV every two hours prn for agitation.

## 2015-01-07 NOTE — Progress Notes (Signed)
[facility] Encounter Note  Patient: Derek [name] / Admit Date: 01/04/2015 / Date of Encounter: 01/07/2015, 12:06 PM   Subjective: Atrial flutter with continued rapid ventricular rate which appears only when he is more active but now is much more controlled and converted to normal sinus rhythm  Review of Systems:  Not able to assess Objective: Telemetry: Atrial flutter with rapid ventricular rate Physical Exam: Blood pressure 149/83, pulse 84, temperature 98.1 F (36.7 C), temperature source Axillary, resp. rate 16, height 5\' 6"  (1.676 m), weight 133 lb 13.1 oz (60.7 kg), SpO2 97 %. Body mass index is 21.61 kg/(m^2). General: Well developed, well nourished, in no acute distress. Head: Normocephalic, atraumatic, sclera non-icteric, no xanthomas, nares are without discharge. Neck: No apparent masses Lungs: Elevated respirations with moderate wheezes, no rhonchi, no rales , basilar crackles   Heart: Regular rate rate and rhythm, normal S1 S2, apical murmur, no rub, no gallop, PMI is normal size and placement, carotid upstroke normal without bruit, jugular venous pressure normal Abdomen: Soft, non-tender, non-distended with normoactive bowel sounds. No hepatosplenomegaly. Abdominal aorta is normal size without bruit Extremities: Trace to 1+ edema, no clubbing, no cyanosis, no ulcers,  Peripheral: 2+ radial, 2+ femoral, 2+ dorsal pedal pulses Neuro: Alert and oriented. Moves all extremities spontaneously.    Intake/Output Summary (Last 24 hours) at 01/07/15 1206 Last data filed at 01/07/15 1100  Gross per 24 hour  Intake  552.5 ml  Output   1225 ml  Net -672.5 ml    Inpatient Medications:  . clonazePAM  0.5 mg Oral BID  . diltiazem  120 mg Oral BID  . docusate sodium  100 mg Oral BID  . enoxaparin (LOVENOX) injection  30 mg Subcutaneous Q24H  . ferrous sulfate  325 mg Oral TID PC  . finasteride  5 mg Oral Daily  . galantamine  8 mg Oral BID WC  . [START ON  01/08/2015] Influenza vac split quadrivalent PF  0.5 mL Intramuscular Tomorrow-1000  . pantoprazole  40 mg Oral BID  . PARoxetine  10 mg Oral BH-q7a  . piperacillin-tazobactam (ZOSYN)  IV  3.375 g Intravenous Q12H  . sodium chloride  3 mL Intravenous Q12H  . tamsulosin  0.4 mg Oral Daily  . vancomycin  1,000 mg Intravenous Q48H   Infusions:  . sodium chloride 75 mL/hr at 01/07/15 1049  . diltiazem (CARDIZEM) infusion 10 mg/hr (01/07/15 1045)    Labs:  Recent Labs  01/06/15 0353 01/07/15 0442  NA 140 143  K 5.6* 5.3*  CL 112* 115*  CO2 21* 21*  GLUCOSE 134* 120*  BUN 56* 69*  CREATININE 3.84* 4.41*  CALCIUM 8.4* 8.4*   No results for input(s): AST, ALT, ALKPHOS, BILITOT, PROT, ALBUMIN in the last 72 hours.  Recent Labs  01/05/15 0646 01/06/15 0353 01/07/15 0442  WBC 9.9 21.4* 14.0*  NEUTROABS 8.4*  --   --   HGB 8.4* 9.8* 8.3*  HCT 25.7* 29.9* 25.5*  MCV 93.3 89.3 89.6  PLT 191 202 171   No results for input(s): CKTOTAL, CKMB, TROPONINI in the last 72 hours. Invalid input(s): POCBNP No results for input(s): HGBA1C in the last 72 hours.   Weights: Filed Weights   01/05/15 0537 01/06/15 0455 01/07/15 0500  Weight: 143 lb 4.8 oz (65 kg) 134 lb 4.2 oz (60.9 kg) 133 lb 13.1 oz (60.7 kg)     Radiology/Studies:  Dg Chest 1 View  01/05/2015   CLINICAL DATA:  Increasing shortness  of Breath following femoral rod placement  EXAM: CHEST  1 VIEW  COMPARISON:  01/04/2015  FINDINGS: Cardiac shadow is within normal limits. Postoperative changes are again seen. Aortic calcifications are again noted. The lungs are well aerated bilaterally with some chronic interstitial change. No focal abnormality is seen.  IMPRESSION: No active disease.   Electronically Signed   By: Inez Catalina M.D.   On: 01/05/2015 11:45   Dg Chest 1 View  01/04/2015   CLINICAL DATA:  Right hip and left hip pain. Unwitnessed fall. Patient on Coumadin. Preoperative study.  EXAM: CHEST  1 VIEW  COMPARISON:   11/03/2014  FINDINGS: Postoperative changes in the mediastinum. Heart size and pulmonary vascularity are normal. No focal airspace disease or consolidation. No pneumothorax. Calcified and tortuous aorta.  IMPRESSION: No active disease.   Electronically Signed   By: Lucienne Capers M.D.   On: 01/04/2015 03:12   Ct Head Wo Contrast  01/04/2015   CLINICAL DATA:  Fall with loss of consciousness. Slip on hardwood floor. On Coumadin with history of dementia.  EXAM: CT HEAD WITHOUT CONTRAST  TECHNIQUE: Contiguous axial images were obtained from the base of the skull through the vertex without intravenous contrast.  COMPARISON:  11/21/2014  FINDINGS: Unchanged cerebral atrophy. Unchanged chronic small vessel ischemic change.No intracranial hemorrhage, mass effect, or midline shift. No hydrocephalus. The basilar cisterns are patent. No evidence of territorial infarct. No intracranial fluid collection. Calvarium is intact. Included paranasal sinuses and mastoid air cells are well aerated.  IMPRESSION: Stable atrophy and chronic small vessel ischemic change without acute intracranial abnormality.   Electronically Signed   By: Jeb Levering M.D.   On: 01/04/2015 02:55   Dg Chest Port 1 View  01/06/2015   CLINICAL DATA:  81 year old male with acute respiratory failure. History of coronary artery disease.  EXAM: PORTABLE CHEST - 1 VIEW  COMPARISON:  Chest x-ray 01/05/2015.  FINDINGS: Lung volumes are slightly low. Airspace consolidation in the right lower lobe. Small right pleural effusion. Mild patchy interstitial prominence in the lungs bilaterally, similar to prior examinations, favored to reflect areas of scarring. Emphysematous changes. No evidence of pulmonary edema. Heart size is borderline enlarged upper mediastinal contours are within normal limits. Atherosclerosis in the thoracic aorta. Status post median sternotomy.  IMPRESSION: 1. Right lower lobe consolidation concerning for pneumonia, with small right  parapneumonic pleural effusion. 2. Chronic lung changes, including emphysema, redemonstrated, as above. 3. Atherosclerosis.   Electronically Signed   By: Vinnie Langton M.D.   On: 01/06/2015 13:55   Dg Hip Operative Unilat With Pelvis Right  01/05/2015   CLINICAL DATA:  Elective surgery.  EXAM: OPERATIVE right HIP (WITH PELVIS IF PERFORMED) 2 VIEWS  TECHNIQUE: Fluoroscopic spot image(s) were submitted for interpretation post-operatively.  COMPARISON:  Pelvis radiography 01/04/2015  FINDINGS: Intraoperative fluoroscopy shows placement of a femoral nail with dynamic femoral neck screw for treatment of a intertrochanteric and subtrochanteric right femur fracture. No evidence of intraoperative fracture or dislocation.  IMPRESSION: Fluoroscopy for proximal femur fracture fixation.   Electronically Signed   By: Monte Fantasia M.D.   On: 01/05/2015 09:30   Dg Hip Unilat With Pelvis 2-3 Views Right  01/04/2015   CLINICAL DATA:  Right-sided hip pain after slip and fall.  EXAM: DG HIP (WITH OR WITHOUT PELVIS) 2-3V RIGHT  COMPARISON:  None.  FINDINGS: Primarily oblique fracture of the right proximal femur with intertrochanteric and subtrochanteric components. Minimal displacement. No significant angulation. Remainder the bony pelvis and left  hip are intact. There are dense atherosclerotic calcifications of the pelvic vasculature. Tacks from bilateral hernia repairs noted.  IMPRESSION: Right proximal femur fracture with mild displacement involving the intertrochanteric and subtrochanteric femur.   Electronically Signed   By: Jeb Levering M.D.   On: 01/04/2015 03:13   Dg Femur Port, Min 2 Views Right  01/05/2015   CLINICAL DATA:  Status post medullary rod placement  EXAM: RIGHT FEMUR PORTABLE 1 VIEW  COMPARISON:  None.  FINDINGS: Medullary rod is noted. The proximal femoral fracture is reduced. A fixation screw is noted both proximally and distally. Heavy vascular calcifications are noted. No acute bony  abnormality or soft tissue abnormality is seen.  IMPRESSION: Status post ORIF of proximal right femoral fracture.   Electronically Signed   By: Inez Catalina M.D.   On: 01/05/2015 10:10     Assessment and Recommendation  81 y.o. male with acute respiratory failure likely secondary to aspiration and/or hypoxia with a syncopal episode and femur fracture and known coronary artery disease without evidence of current myocardial infarction but continued atrial flutter with rapid ventricular rate now converted to normal sinus rhythm with good heart rate control due to improvements of illness and recent fall 1. Continue diltiazem drip for heart rate control and maintenance of normal sinus rhythm and blood pressure control 2. Anticoagulation if able if not causing anemia to reduce risk of stroke and deep venous thrombosis 3. Continue aggressive pulmonary treatment and care which appears to be the primary issue which is improving 4. Possible addition of oral medication management for heart rate control and/or use of amiodarone if patient continues to have significant high heart rate and begins to affect his overall outcome  Signed, Serafina Royals M.D. FACC

## 2015-01-07 NOTE — Progress Notes (Signed)
After haldol administration while patient still agitated, patient's heart rate increased into 150s, not Vtach, still in aflutter. Notified Dr. Nehemiah Massed. He did not discontinue the haldol, but ordered a 20 mg bolus of cardizem through the IV pump to try and correct heart rate. MD told RN that patient might still be tachycardiac in the 120s and 130s but to continue the cardizem at 15 mg/hr after bolus.

## 2015-01-07 NOTE — Progress Notes (Signed)
Anticoag Monitoring: Patient is s/p repair of femur fracture. On Coumadin prior to admission for a-fib, currently on hold due to surgery. MD order for Lovenox 40mg  SQ q24h for post-op DVT prevention. Patient with est.CrCl of 11.3 ml/min.  Will decrease to Lovenox 30mg  SQ q24h for est. CrCl less than 44RX/VQM per policy.  Paulina Fusi, PharmD, BCPS 01/07/2015 9:10 AM

## 2015-01-07 NOTE — Evaluation (Signed)
Physical Therapy Evaluation Patient Details Name: Derek [name] MRN: [id number] DOB: 1930-10-24 Today's Date: 01/07/2015   History of Present Illness  R hip fx with ORIF  Clinical Impression  Pt is an 81 y/o male here with R hip fx, ORIF.  He is very lethargic and though he is able to state his name he generally is unable to have a conversation and is mumbling incoherently most of the time (the sitter who was present agrees that he has not been able to be meaningfully communicate.)  Pt was only able to do some minimal effort during exercises, mostly just indicates pain.    Follow Up Recommendations SNF    Equipment Recommendations       Recommendations for Other Services       Precautions / Restrictions Precautions Precautions: Fall Restrictions Weight Bearing Restrictions: Yes RLE Weight Bearing: Partial weight bearing      Mobility  Bed Mobility Overal bed mobility:  (deferred any mobility acts secondary to lethargy/confusion)                Transfers                    Ambulation/Gait                Stairs            Wheelchair Mobility    Modified Rankin (Stroke Patients Only)       Balance                                             Pertinent Vitals/Pain Pain Assessment:  (unable to rate, indicated much pain with R hip movement)    Home Living Family/patient expects to be discharged to:: Skilled nursing facility                      Prior Function           Comments: Pt is unable to answer questions/speak coherently     Hand Dominance        Extremity/Trunk Assessment   Upper Extremity Assessment: Difficult to assess due to impaired cognition           Lower Extremity Assessment: Difficult to assess due to impaired cognition;RLE deficits/detail RLE Deficits / Details: Pt with very little R LE AROM (some on the L), grossly limited t/o secondary to confusion and pain.         Communication      Cognition Arousal/Alertness: Lethargic Behavior During Therapy: Restless Overall Cognitive Status: Impaired/Different from baseline                      General Comments      Exercises General Exercises - Lower Extremity Ankle Circles/Pumps: PROM;15 reps Quad Sets: AAROM;10 reps Short Arc Quad: AAROM;PROM;10 reps Heel Slides: 5 reps;PROM (c/o pain) Hip ABduction/ADduction: 10 reps;PROM (c/o pain)      Assessment/Plan    PT Assessment Patient needs continued PT services  PT Diagnosis Difficulty walking;Generalized weakness   PT Problem List Decreased strength;Decreased range of motion;Decreased activity tolerance;Decreased balance;Decreased mobility;Decreased cognition;Decreased safety awareness  PT Treatment Interventions DME instruction;Gait training;Functional mobility training;Therapeutic activities;Therapeutic exercise;Balance training;Neuromuscular re-education   PT Goals (Current goals can be found in the Care Plan section) Acute Rehab PT Goals Patient Stated Goal: unable to verbalize appropriately Time  For Goal Achievement: 01/21/15 Potential to Achieve Goals: Fair    Frequency BID (per ability to participate)   Barriers to discharge        Co-evaluation               End of Session                 Time: 0761-5183 PT Time Calculation (min) (ACUTE ONLY): 13 min   Charges:   PT Evaluation $Initial PT Evaluation Tier I: 1 Procedure     PT G Codes:       Derek [name], PT, DPT [phone]  Derek [name] 01/07/2015, 3:38 PM

## 2015-01-07 NOTE — Progress Notes (Signed)
ANTICOAGULATION CONSULT NOTE - Initial Consult  Pharmacy Consult for Coumadin Indication: atrial fibrillation  Allergies  Allergen Reactions  . Pradaxa [Dabigatran Etexilate Mesylate] Other (See Comments)    Patient Measurements: Height: 5\' 6"  (167.6 cm) Weight: 133 lb 13.1 oz (60.7 kg) IBW/kg (Calculated) : 63.8   Vital Signs: Temp: 98.6 F (37 C) (08/28 0000) Temp Source: Axillary (08/28 0000) BP: 147/71 mmHg (08/28 0600) Pulse Rate: 88 (08/28 0600)  Labs:  Recent Labs  01/05/15 0646 01/05/15 1322 01/06/15 0353 01/07/15 0442  HGB 8.4*  --  9.8* 8.3*  HCT 25.7*  --  29.9* 25.5*  PLT 191  --  202 171  LABPROT 14.9 15.6* 14.1 15.3*  INR 1.15 1.22 1.07 1.19  CREATININE 3.33*  --  3.84* 4.41*    Estimated Creatinine Clearance: 11.3 mL/min (by C-G formula based on Cr of 4.41).   Medical History: Past Medical History  Diagnosis Date  . Undiagnosed cardiac murmurs   . CAD (coronary artery disease)   . Other and unspecified hyperlipidemia   . Unspecified essential hypertension   . Diaphragmatic hernia without mention of obstruction or gangrene   . Sensory hearing loss, unilateral   . Colon polyps   . Impaired fasting glucose   . Esophageal reflux   . Elevated prostate specific antigen (PSA)   . Unspecified disorder resulting from impaired renal function   . Unspecified disorder of kidney and ureter   . Alzheimer's dementia     Medications:  Prescriptions prior to admission  Medication Sig Dispense Refill Last Dose  . acetaminophen (TYLENOL) 500 MG tablet Take 1,000 mg by mouth every 6 (six) hours as needed for mild pain or headache.   01/03/2015 at Unknown time  . clonazePAM (KLONOPIN) 0.5 MG tablet Take 1 tablet by mouth 2 (two) times daily.   01/03/2015 at Unknown time  . diltiazem (CARDIZEM CD) 120 MG 24 hr capsule Take 120 mg by mouth 2 (two) times daily.   01/03/2015 at Unknown time  . finasteride (PROSCAR) 5 MG tablet Take 5 mg by mouth daily.   01/03/2015  at Unknown time  . furosemide (LASIX) 20 MG tablet Take 20 mg by mouth daily.   01/03/2015 at Unknown time  . galantamine (RAZADYNE) 8 MG tablet Take 8 mg by mouth 2 (two) times daily with a meal.   01/03/2015 at Unknown time  . losartan (COZAAR) 100 MG tablet Take 100 mg by mouth daily.   01/03/2015 at Unknown time  . pantoprazole (PROTONIX) 40 MG tablet Take 40 mg by mouth 2 (two) times daily.   01/03/2015 at Unknown time  . tamsulosin (FLOMAX) 0.4 MG CAPS capsule Take 0.4 mg by mouth daily.   01/03/2015 at Unknown time  . warfarin (COUMADIN) 3 MG tablet Take 1.5-3 mg by mouth at bedtime. 0.5 tablet on Monday, Wednesday, and Friday; 1 tablet on Tuesday, Thursday, Saturday, and Sunday   01/03/2015 at Unknown time  . citalopram (CELEXA) 20 MG tablet Take 1 tablet by mouth daily.  5 Not Taking at Unknown time  . PARoxetine (PAXIL) 10 MG tablet Take 10 mg by mouth every morning.   Not Taking at Unknown time   Scheduled:  . clonazePAM  0.5 mg Oral BID  . diltiazem  120 mg Oral BID  . docusate sodium  100 mg Oral BID  . ferrous sulfate  325 mg Oral TID PC  . finasteride  5 mg Oral Daily  . furosemide  20 mg Oral Daily  .  galantamine  8 mg Oral BID WC  . heparin subcutaneous  5,000 Units Subcutaneous Q12H  . losartan  100 mg Oral Daily  . pantoprazole  40 mg Oral BID  . PARoxetine  10 mg Oral BH-q7a  . piperacillin-tazobactam (ZOSYN)  IV  3.375 g Intravenous Q12H  . sodium chloride  3 mL Intravenous Q12H  . tamsulosin  0.4 mg Oral Daily  . vancomycin  1,000 mg Intravenous Q48H   Infusions:  . diltiazem (CARDIZEM) infusion 15 mg/hr (01/07/15 0343)   PRN: acetaminophen, alum & mag hydroxide-simeth, bisacodyl, HYDROcodone-acetaminophen, magnesium citrate, menthol-cetylpyridinium **OR** phenol, morphine injection, ondansetron **OR** ondansetron (ZOFRAN) IV, polyethylene glycol  Assessment: 81 y/o M on warfarin PTA admitted for hip fracture repair developing aflutter after surgery. INR was  therapeutic on admission on home dose of Coumadin 1.5 mg MWF and 3 mg TThSaSu. Patient received phytonadione 10 mg iv x 2 prior to surgery.   8/27 INR = 1.07.  Goal of Therapy:  INR 2-3  Plan:  Will continue warfarin outpatient dosing of Coumadin 1.5 mg MWF and 3 mg TThSaSu and f/u AM INR.  8/28 AM INR 1.19. Warfarin orders and consult d/c. Daily INR d/c per protocol.  Semaja Lymon S 01/07/2015,6:27 AM

## 2015-01-07 NOTE — Progress Notes (Signed)
Derek [name] is a 81 y.o. male   SUBJECTIVE: Patient suffered  fracturing left proximal femur.  Had surgery 8/26 but decompensated witth hypoxia, A flutter , requiring NRBM Transferred to step down.  CXR negative initially but then RLL consolidation and WBC spiked confirming likely aspiration  Seen by cards- on dilt drip - HR down to 80s. BP stable. On 55% FiO2 Not taking Pos, no appetite. Worsening renal function  ______________________________________________________________________  ROS: Review of systems is unremarkable for any active cardiac,respiratory, GI, GU, hematologic, neurologic or psychiatric systems, 10 systems reviewed.  . clonazePAM  0.5 mg Oral BID  . diltiazem  120 mg Oral BID  . docusate sodium  100 mg Oral BID  . enoxaparin (LOVENOX) injection  40 mg Subcutaneous Q24H  . ferrous sulfate  325 mg Oral TID PC  . finasteride  5 mg Oral Daily  . furosemide  20 mg Oral Daily  . galantamine  8 mg Oral BID WC  . losartan  100 mg Oral Daily  . pantoprazole  40 mg Oral BID  . PARoxetine  10 mg Oral BH-q7a  . piperacillin-tazobactam (ZOSYN)  IV  3.375 g Intravenous Q12H  . sodium chloride  3 mL Intravenous Q12H  . tamsulosin  0.4 mg Oral Daily  . vancomycin  1,000 mg Intravenous Q48H   acetaminophen, alum & mag hydroxide-simeth, bisacodyl, HYDROcodone-acetaminophen, magnesium citrate, menthol-cetylpyridinium **OR** phenol, morphine injection, ondansetron **OR** ondansetron (ZOFRAN) IV, polyethylene glycol   Past Medical History  Diagnosis Date  . Undiagnosed cardiac murmurs   . CAD (coronary artery disease)   . Other and unspecified hyperlipidemia   . Unspecified essential hypertension   . Diaphragmatic hernia without mention of obstruction or gangrene   . Sensory hearing loss, unilateral   . Colon polyps   . Impaired fasting glucose   . Esophageal reflux   . Elevated prostate specific antigen (PSA)   . Unspecified disorder resulting from impaired renal function    . Unspecified disorder of kidney and ureter   . Alzheimer's dementia     Past Surgical History  Procedure Laterality Date  . Polyp removal  1991  . Hop r/o  1994    HTN past mild MI Dr. Gwenlyn Found  . Stress cardiolite  4/99    nml. Dr. Percival Spanish. no change when done 02/06/03  . Colonoscopy  10/99    no polyps. Dr. Vira Agar  . Esophagogastroduodenoscopy  08/15/02    hh erosions of stomah. Dr. Vira Agar  . Hernia repair  05/11/03  . Esophagogastroduodenoscopy  08/15/02    hh erosive gastropathy- duodenal mass   . Cystoscopy  5/07     thickening of bladder wall; other wise wnl    PHYSICAL EXAM:  BP 147/71 mmHg  Pulse 88  Temp(Src) 98.6 F (37 C) (Axillary)  Resp 16  Ht 5\' 6"  (1.676 m)  Wt 60.7 kg (133 lb 13.1 oz)  BMI 21.61 kg/m2  SpO2 98%  Wt Readings from Last 3 Encounters:  01/07/15 60.7 kg (133 lb 13.1 oz)  11/21/14 65.772 kg (145 lb)  11/03/14 58.968 kg (130 lb)           BP Readings from Last 3 Encounters:  01/07/15 147/71  11/22/14 185/99  11/04/14 166/67   Constitutional: ill appearing Neck: supple, no thyromegaly Respiratory: bilateral rhonchi Cardiovascular: IRR, tachy Abdomen: soft, nml BS, nontender Extremities: no edema, moderate right anterior tenderness Neuro: lethargic   Lab Results  Component Value Date   CREATININE 4.41* 01/07/2015   BUN  69* 01/07/2015   NA 143 01/07/2015   K 5.3* 01/07/2015   CL 115* 01/07/2015   CO2 21* 01/07/2015   Lab Results  Component Value Date   WBC 14.0* 01/07/2015   HGB 8.3* 01/07/2015   HCT 25.5* 01/07/2015   MCV 89.6 01/07/2015   PLT 171 01/07/2015   Dg Chest 1 View  01/05/2015   CLINICAL DATA:  Increasing shortness of Breath following femoral rod placement  EXAM: CHEST  1 VIEW  COMPARISON:  01/04/2015  FINDINGS: Cardiac shadow is within normal limits. Postoperative changes are again seen. Aortic calcifications are again noted. The lungs are well aerated bilaterally with some chronic interstitial change. No focal  abnormality is seen.  IMPRESSION: No active disease.   Electronically Signed   By: Inez Catalina M.D.   On: 01/05/2015 11:45   Dg Chest 1 View  01/04/2015   CLINICAL DATA:  Right hip and left hip pain. Unwitnessed fall. Patient on Coumadin. Preoperative study.  EXAM: CHEST  1 VIEW  COMPARISON:  11/03/2014  FINDINGS: Postoperative changes in the mediastinum. Heart size and pulmonary vascularity are normal. No focal airspace disease or consolidation. No pneumothorax. Calcified and tortuous aorta.  IMPRESSION: No active disease.   Electronically Signed   By: Lucienne Capers M.D.   On: 01/04/2015 03:12   Ct Head Wo Contrast  01/04/2015   CLINICAL DATA:  Fall with loss of consciousness. Slip on hardwood floor. On Coumadin with history of dementia.  EXAM: CT HEAD WITHOUT CONTRAST  TECHNIQUE: Contiguous axial images were obtained from the base of the skull through the vertex without intravenous contrast.  COMPARISON:  11/21/2014  FINDINGS: Unchanged cerebral atrophy. Unchanged chronic small vessel ischemic change.No intracranial hemorrhage, mass effect, or midline shift. No hydrocephalus. The basilar cisterns are patent. No evidence of territorial infarct. No intracranial fluid collection. Calvarium is intact. Included paranasal sinuses and mastoid air cells are well aerated.  IMPRESSION: Stable atrophy and chronic small vessel ischemic change without acute intracranial abnormality.   Electronically Signed   By: Jeb Levering M.D.   On: 01/04/2015 02:55   Dg Chest Port 1 View  01/06/2015   CLINICAL DATA:  81 year old male with acute respiratory failure. History of coronary artery disease.  EXAM: PORTABLE CHEST - 1 VIEW  COMPARISON:  Chest x-ray 01/05/2015.  FINDINGS: Lung volumes are slightly low. Airspace consolidation in the right lower lobe. Small right pleural effusion. Mild patchy interstitial prominence in the lungs bilaterally, similar to prior examinations, favored to reflect areas of scarring.  Emphysematous changes. No evidence of pulmonary edema. Heart size is borderline enlarged upper mediastinal contours are within normal limits. Atherosclerosis in the thoracic aorta. Status post median sternotomy.  IMPRESSION: 1. Right lower lobe consolidation concerning for pneumonia, with small right parapneumonic pleural effusion. 2. Chronic lung changes, including emphysema, redemonstrated, as above. 3. Atherosclerosis.   Electronically Signed   By: Vinnie Langton M.D.   On: 01/06/2015 13:55   Dg Hip Operative Unilat With Pelvis Right  01/05/2015   CLINICAL DATA:  Elective surgery.  EXAM: OPERATIVE right HIP (WITH PELVIS IF PERFORMED) 2 VIEWS  TECHNIQUE: Fluoroscopic spot image(s) were submitted for interpretation post-operatively.  COMPARISON:  Pelvis radiography 01/04/2015  FINDINGS: Intraoperative fluoroscopy shows placement of a femoral nail with dynamic femoral neck screw for treatment of a intertrochanteric and subtrochanteric right femur fracture. No evidence of intraoperative fracture or dislocation.  IMPRESSION: Fluoroscopy for proximal femur fracture fixation.   Electronically Signed   By: Monte Fantasia  M.D.   On: 01/05/2015 09:30   Dg Hip Unilat With Pelvis 2-3 Views Right  01/04/2015   CLINICAL DATA:  Right-sided hip pain after slip and fall.  EXAM: DG HIP (WITH OR WITHOUT PELVIS) 2-3V RIGHT  COMPARISON:  None.  FINDINGS: Primarily oblique fracture of the right proximal femur with intertrochanteric and subtrochanteric components. Minimal displacement. No significant angulation. Remainder the bony pelvis and left hip are intact. There are dense atherosclerotic calcifications of the pelvic vasculature. Tacks from bilateral hernia repairs noted.  IMPRESSION: Right proximal femur fracture with mild displacement involving the intertrochanteric and subtrochanteric femur.   Electronically Signed   By: Jeb Levering M.D.   On: 01/04/2015 03:13   Dg Femur Port, Min 2 Views Right  01/05/2015    CLINICAL DATA:  Status post medullary rod placement  EXAM: RIGHT FEMUR PORTABLE 1 VIEW  COMPARISON:  None.  FINDINGS: Medullary rod is noted. The proximal femoral fracture is reduced. A fixation screw is noted both proximally and distally. Heavy vascular calcifications are noted. No acute bony abnormality or soft tissue abnormality is seen.  IMPRESSION: Status post ORIF of proximal right femoral fracture.   Electronically Signed   By: Inez Catalina M.D.   On: 01/05/2015 10:10    ASSESSMENT/PLAN: Resp failure- likley aspiration pna vs pulm edema from tachycardia - cont abx vanco and zosyn for HCAP and aspiration -  - pulm toilet  - Repeat CXR done 8/27 confirms RLL Pna   A flutter with RVR - on dilt drip- HR improved but not taking po -  Cardiology help appreciated Gentle diuresis - got lasix in PACU 8/26 . Will hold further lasix Holding coumadin   Femoral fracture-orthopedic evaluation - s/p surgery  holding Coumadin but may restart once stable per cards and ortho - will start lovenox for post op ppx per Dr Sabra Heck  Progressive aphasia-associated dementia associated with delirium, galantamine and twice a day Klonopin  CKD4-baseline creatinine 3.7 with associated anemia - BUN cr elevated - will hold lasix, given gentle IVF Hold losartan Renal Consult  Overall poor prognosis, will need skilled nursing Pt is DNR

## 2015-01-08 ENCOUNTER — Encounter: Payer: Self-pay | Admitting: Orthopedic Surgery

## 2015-01-08 DIAGNOSIS — F0280 Dementia in other diseases classified elsewhere without behavioral disturbance: Secondary | ICD-10-CM

## 2015-01-08 DIAGNOSIS — N189 Chronic kidney disease, unspecified: Secondary | ICD-10-CM

## 2015-01-08 DIAGNOSIS — J690 Pneumonitis due to inhalation of food and vomit: Secondary | ICD-10-CM

## 2015-01-08 DIAGNOSIS — S72001A Fracture of unspecified part of neck of right femur, initial encounter for closed fracture: Secondary | ICD-10-CM

## 2015-01-08 DIAGNOSIS — Z515 Encounter for palliative care: Secondary | ICD-10-CM

## 2015-01-08 DIAGNOSIS — G309 Alzheimer's disease, unspecified: Secondary | ICD-10-CM

## 2015-01-08 LAB — BASIC METABOLIC PANEL
Anion gap: 9 (ref 5–15)
BUN: 74 mg/dL — AB (ref 6–20)
CALCIUM: 8.2 mg/dL — AB (ref 8.9–10.3)
CO2: 20 mmol/L — AB (ref 22–32)
CREATININE: 4.33 mg/dL — AB (ref 0.61–1.24)
Chloride: 112 mmol/L — ABNORMAL HIGH (ref 101–111)
GFR calc non Af Amer: 12 mL/min — ABNORMAL LOW (ref >60)
GFR, EST AFRICAN AMERICAN: 13 mL/min — AB (ref >60)
Glucose, Bld: 94 mg/dL (ref 65–99)
Potassium: 4.8 mmol/L (ref 3.5–5.1)
SODIUM: 141 mmol/L (ref 135–145)

## 2015-01-08 LAB — CBC
HEMATOCRIT: 27.1 % — AB (ref 40.0–52.0)
Hemoglobin: 8.9 g/dL — ABNORMAL LOW (ref 13.0–18.0)
MCH: 29.6 pg (ref 26.0–34.0)
MCHC: 32.7 g/dL (ref 32.0–36.0)
MCV: 90.4 fL (ref 80.0–100.0)
Platelets: 169 10*3/uL (ref 150–440)
RBC: 3 MIL/uL — ABNORMAL LOW (ref 4.40–5.90)
RDW: 15.1 % — AB (ref 11.5–14.5)
WBC: 10.9 10*3/uL — ABNORMAL HIGH (ref 3.8–10.6)

## 2015-01-08 MED ORDER — HEPARIN SODIUM (PORCINE) 5000 UNIT/ML IJ SOLN
5000.0000 [IU] | Freq: Two times a day (BID) | INTRAMUSCULAR | Status: DC
  Administered 2015-01-08 – 2015-01-09 (×4): 5000 [IU] via SUBCUTANEOUS
  Filled 2015-01-08 (×4): qty 1

## 2015-01-08 MED ORDER — MORPHINE SULFATE (PF) 4 MG/ML IV SOLN
3.0000 mg | INTRAVENOUS | Status: DC | PRN
  Administered 2015-01-08 – 2015-01-09 (×2): 3 mg via INTRAVENOUS
  Filled 2015-01-08 (×2): qty 1

## 2015-01-08 MED ORDER — HALOPERIDOL LACTATE 5 MG/ML IJ SOLN
5.0000 mg | Freq: Four times a day (QID) | INTRAMUSCULAR | Status: DC | PRN
  Administered 2015-01-08 – 2015-01-09 (×3): 5 mg via INTRAVENOUS
  Filled 2015-01-08 (×4): qty 1

## 2015-01-08 MED ORDER — MORPHINE SULFATE (PF) 2 MG/ML IV SOLN: 2.0000 mg | INTRAVENOUS | Status: DC | PRN

## 2015-01-08 MED ORDER — PANTOPRAZOLE SODIUM 40 MG IV SOLR
40.0000 mg | Freq: Two times a day (BID) | INTRAVENOUS | Status: DC
  Administered 2015-01-08 – 2015-01-09 (×4): 40 mg via INTRAVENOUS
  Filled 2015-01-08 (×4): qty 40

## 2015-01-08 NOTE — Progress Notes (Signed)
Subjective:  Pt remains critically ill. He remains on an non rebreather mask. No new Cr this AM. UOP 817cc over the past 24 hours. Didn't rest well overnight per nursing.   Objective:  Vital signs in last 24 hours:  Temp:  [97.8 F (36.6 C)-98.8 F (37.1 C)] 97.8 F (36.6 C) (08/29 0800) Pulse Rate:  [49-153] 148 (08/29 0700) Resp:  [12-28] 18 (08/29 0700) BP: (120-179)/(56-103) 158/75 mmHg (08/29 0700) SpO2:  [87 %-100 %] 98 % (08/29 0700) FiO2 (%):  [50 %-100 %] 100 % (08/29 0200) Weight:  [58.3 kg (128 lb 8.5 oz)] 58.3 kg (128 lb 8.5 oz) (08/29 0547)  Weight change: -2.4 kg (-5 lb 4.7 oz) Filed Weights   01/06/15 0455 01/07/15 0500 01/08/15 0547  Weight: 60.9 kg (134 lb 4.2 oz) 60.7 kg (133 lb 13.1 oz) 58.3 kg (128 lb 8.5 oz)    Intake/Output:    Intake/Output Summary (Last 24 hours) at 01/08/15 0915 Last data filed at 01/08/15 0800  Gross per 24 hour  Intake 2131.92 ml  Output    817 ml  Net 1314.92 ml     Physical Exam: General:  critically ill-appearing, frail, elderly  HEENT  dry oral mucous membranes, anicteric, face mask for oxygen  Neck  supple  Pulm/lungs  bilateral rhonchi, normal effort  CVS/Heart  irregular, no rubs  Abdomen:   soft, nontender, nondistended  Extremities:  no peripheral edema, right hip surgical bandage  Neurologic:  lethargic, not following commands  Skin:  no acute rashes  GU: Foley present       Basic Metabolic Panel:   Recent Labs Lab 01/04/15 0235 01/05/15 0646 01/06/15 0353 01/07/15 0442  NA 142 140 140 143  K 5.6* 6.1* 5.6* 5.3*  CL 120* 116* 112* 115*  CO2 22 21* 21* 21*  GLUCOSE 81 116* 134* 120*  BUN 61* 52* 56* 69*  CREATININE 3.77* 3.33* 3.84* 4.41*  CALCIUM 8.2* 8.2* 8.4* 8.4*     CBC:  Recent Labs Lab 01/04/15 0235 01/05/15 0646 01/06/15 0353 01/07/15 0442 01/08/15 0310  WBC 5.6 9.9 21.4* 14.0* 10.9*  NEUTROABS 4.1 8.4*  --   --   --   HGB 8.1* 8.4* 9.8* 8.3* 8.9*  HCT 24.9* 25.7* 29.9*  25.5* 27.1*  MCV 92.7 93.3 89.3 89.6 90.4  PLT 204 191 202 171 169      Microbiology:  Recent Results (from the past 720 hour(s))  Surgical pcr screen     Status: None   Collection Time: 01/04/15 10:58 AM  Result Value Ref Range Status   MRSA, PCR NEGATIVE NEGATIVE Final   Staphylococcus aureus NEGATIVE NEGATIVE Final    Comment:        The Xpert SA Assay (FDA approved for NASAL specimens in patients over 31 years of age), is one component of a comprehensive surveillance program.  Test performance has been validated by [facility] for patients greater than or equal to 40 year old. It is not intended to diagnose infection nor to guide or monitor treatment.   MRSA PCR Screening     Status: None   Collection Time: 01/05/15  1:55 PM  Result Value Ref Range Status   MRSA by PCR NEGATIVE NEGATIVE Final    Comment:        The GeneXpert MRSA Assay (FDA approved for NASAL specimens only), is one component of a comprehensive MRSA colonization surveillance program. It is not intended to diagnose MRSA infection nor to guide or monitor treatment  for MRSA infections.     Coagulation Studies:  Recent Labs  01/05/15 1322 01/06/15 0353 01/07/15 0442  LABPROT 15.6* 14.1 15.3*  INR 1.22 1.07 1.19    Urinalysis: No results for input(s): COLORURINE, LABSPEC, PHURINE, GLUCOSEU, HGBUR, BILIRUBINUR, KETONESUR, PROTEINUR, UROBILINOGEN, NITRITE, LEUKOCYTESUR in the last 72 hours.  Invalid input(s): APPERANCEUR    Imaging: Dg Chest Port 1 View  01/06/2015   CLINICAL DATA:  81 year old male with acute respiratory failure. History of coronary artery disease.  EXAM: PORTABLE CHEST - 1 VIEW  COMPARISON:  Chest x-ray 01/05/2015.  FINDINGS: Lung volumes are slightly low. Airspace consolidation in the right lower lobe. Small right pleural effusion. Mild patchy interstitial prominence in the lungs bilaterally, similar to prior examinations, favored to reflect areas of scarring.  Emphysematous changes. No evidence of pulmonary edema. Heart size is borderline enlarged upper mediastinal contours are within normal limits. Atherosclerosis in the thoracic aorta. Status post median sternotomy.  IMPRESSION: 1. Right lower lobe consolidation concerning for pneumonia, with small right parapneumonic pleural effusion. 2. Chronic lung changes, including emphysema, redemonstrated, as above. 3. Atherosclerosis.   Electronically Signed   By: Vinnie Langton M.D.   On: 01/06/2015 13:55     Medications:   . sodium chloride 75 mL/hr at 01/07/15 2355  . diltiazem (CARDIZEM) infusion 5 mg/hr (01/08/15 0800)   . clonazePAM  0.5 mg Oral BID  . diltiazem  120 mg Oral BID  . docusate sodium  100 mg Oral BID  . enoxaparin (LOVENOX) injection  30 mg Subcutaneous Q24H  . ferrous sulfate  325 mg Oral TID PC  . finasteride  5 mg Oral Daily  . galantamine  8 mg Oral BID WC  . Influenza vac split quadrivalent PF  0.5 mL Intramuscular Tomorrow-1000  . pantoprazole (PROTONIX) IV  40 mg Intravenous Q12H  . PARoxetine  10 mg Oral BH-q7a  . piperacillin-tazobactam (ZOSYN)  IV  3.375 g Intravenous Q12H  . sodium chloride  3 mL Intravenous Q12H  . tamsulosin  0.4 mg Oral Daily  . vancomycin  1,000 mg Intravenous Q48H   acetaminophen, alum & mag hydroxide-simeth, bisacodyl, haloperidol lactate, HYDROcodone-acetaminophen, menthol-cetylpyridinium **OR** phenol, morphine injection, ondansetron **OR** ondansetron (ZOFRAN) IV, polyethylene glycol  Assessment/ Plan:  81 y.o. male with past medical history of anemia, atrial fibrillation, hypertension, hyperlipidemia, BPH, Aortic stenosis and coronary artery disease. Hospital course was complicated by aspiration pneumonia and acute kidney injury.  1. Acute renal failure. due ATN from hemodynamic instability and aspiration pneumonia Remains critically ill at the moment, will continue gentle IVF hydration for now, we have ordered follow up renal function  testing this AM.  No urgent indication for HD as pt is producing some urine at the moment. 2. bacterial pneumonia/acute respiratory failure Pt remains on nonrebreather mask, continue vancomycin and zosyn for now. 3. Chronic kidney disease stage IV. Baseline creatinine 3.7/GFR 16:  As before patient wouldn't make a good long term dialysis candidate.         LOS: 4 Aleia Larocca 8/29/20169:15 AM

## 2015-01-08 NOTE — Progress Notes (Signed)
PT Cancellation Note  Patient Details Name: ITAY MELLA MRN: [id number] DOB: 1931-04-24   Cancelled Treatment:    Reason Eval/Treat Not Completed:  (see PT cancellation note). Pt still agitated, observed swatting at visitors in room. Pt not appropriate for therapy at this time. Will re-attempt next date, if medically stable.   Ineta Sinning 01/08/2015, 2:12 PM  Greggory Stallion, PT, DPT [phone]

## 2015-01-08 NOTE — Clinical Social Work Note (Signed)
CSW spoke to pt's daughter Jan by phone and extended bed offers.  The family selected [facility].  CSW informed Development worker, community of family's selection.  CSW explained to family and facility that pt is currently still in ICU.  CSW will continue to follow and assist with d/c planning needs.  [place], [place]

## 2015-01-08 NOTE — Progress Notes (Signed)
Dr. Nehemiah Massed made aware of heartrate increased to 130' /140's and sustaining- Increased Cardizem drip to 15mg /hr- no new orders.

## 2015-01-08 NOTE — Progress Notes (Signed)
  Subjective:  Patient is confused.  He is unable to provide a history. He does not appear to be in pain or distress.   Objective:   VITALS:   Filed Vitals:   01/08/15 1400 01/08/15 1458 01/08/15 1500 01/08/15 1600  BP:   127/59 126/63  Pulse: 93  92 71  Temp:  99.4 F (37.4 C)    TempSrc:  Axillary    Resp: 17  17 15   Height:      Weight:      SpO2: 94%  97% 96%    PHYSICAL EXAM:  Limitation of the right lower extremity reveals his incisions are clean dry and intact. Thigh compartments are soft and compressible. His lower leg compartments are soft and compressible. He has spontaneous movements of his toes. His toes appear well perfused. He has palpable pedal pulses. Sensation cannot be determined based on the patient's confusion.  LABS  Results for orders placed or performed during the hospital encounter of 01/04/15 (from the past 24 hour(s))  CBC     Status: Abnormal   Collection Time: 01/08/15  3:10 AM  Result Value Ref Range   WBC 10.9 (H) 3.8 - 10.6 K/uL   RBC 3.00 (L) 4.40 - 5.90 MIL/uL   Hemoglobin 8.9 (L) 13.0 - 18.0 g/dL   HCT 27.1 (L) 40.0 - 52.0 %   MCV 90.4 80.0 - 100.0 fL   MCH 29.6 26.0 - 34.0 pg   MCHC 32.7 32.0 - 36.0 g/dL   RDW 15.1 (H) 11.5 - 14.5 %   Platelets 169 150 - 440 K/uL  Basic metabolic panel     Status: Abnormal   Collection Time: 01/08/15  3:10 AM  Result Value Ref Range   Sodium 141 135 - 145 mmol/L   Potassium 4.8 3.5 - 5.1 mmol/L   Chloride 112 (H) 101 - 111 mmol/L   CO2 20 (L) 22 - 32 mmol/L   Glucose, Bld 94 65 - 99 mg/dL   BUN 74 (H) 6 - 20 mg/dL   Creatinine, Ser 4.33 (H) 0.61 - 1.24 mg/dL   Calcium 8.2 (L) 8.9 - 10.3 mg/dL   GFR calc non Af Amer 12 (L) >60 mL/min   GFR calc Af Amer 13 (L) >60 mL/min   Anion gap 9 5 - 15    No results found.  Assessment/Plan: 3 Days Post-Op   Active Problems:   Femur fracture  Patient remains confused postop. He continues to be tachycardic.  Patient evaluated by palliative care today.  Awaiting further recommendations. No postop issues and orthopedic standpoint. I will continue to follow.   Thornton Park , MD 01/08/2015, 5:12 PM

## 2015-01-08 NOTE — Progress Notes (Signed)
[facility] Encounter Note  Patient: Derek [name] / Admit Date: 01/04/2015 / Date of Encounter: 01/08/2015, 8:26 AM   Subjective: Recurrence of tachycardia with rapid rate now more improved with continued use of diltiazem drip. Patient still very weak and still having significant concerns of hypoxia Review of Systems:  Not able to assess Objective: Telemetry: Atrial flutter with rapid ventricular rate Physical Exam: Blood pressure 158/75, pulse 148, temperature 97.8 F (36.6 C), temperature source Axillary, resp. rate 18, height 5\' 6"  (1.676 m), weight 128 lb 8.5 oz (58.3 kg), SpO2 98 %. Body mass index is 20.75 kg/(m^2). General: Well developed, well nourished, in no acute distress. Head: Normocephalic, atraumatic, sclera non-icteric, no xanthomas, nares are without discharge. Neck: No apparent masses Lungs: Elevated respirations with moderate wheezes, no rhonchi, no rales , basilar crackles   Heart: Irregular rate rate and rhythm, normal S1 S2, apical murmur, no rub, no gallop, PMI is normal size and placement, carotid upstroke normal without bruit, jugular venous pressure normal Abdomen: Soft, non-tender, non-distended with normoactive bowel sounds. No hepatosplenomegaly. Abdominal aorta is normal size without bruit Extremities: Trace to 1+ edema, no clubbing, no cyanosis, no ulcers,  Peripheral: 2+ radial, 2+ femoral, 2+ dorsal pedal pulses Neuro: Alert and oriented. Moves all extremities spontaneously.    Intake/Output Summary (Last 24 hours) at 01/08/15 0826 Last data filed at 01/08/15 0700  Gross per 24 hour  Intake 2081.92 ml  Output    992 ml  Net 1089.92 ml    Inpatient Medications:  . clonazePAM  0.5 mg Oral BID  . diltiazem  120 mg Oral BID  . docusate sodium  100 mg Oral BID  . enoxaparin (LOVENOX) injection  30 mg Subcutaneous Q24H  . ferrous sulfate  325 mg Oral TID PC  . finasteride  5 mg Oral Daily  . galantamine  8 mg Oral BID WC  .  Influenza vac split quadrivalent PF  0.5 mL Intramuscular Tomorrow-1000  . pantoprazole (PROTONIX) IV  40 mg Intravenous Q12H  . PARoxetine  10 mg Oral BH-q7a  . piperacillin-tazobactam (ZOSYN)  IV  3.375 g Intravenous Q12H  . sodium chloride  3 mL Intravenous Q12H  . tamsulosin  0.4 mg Oral Daily  . vancomycin  1,000 mg Intravenous Q48H   Infusions:  . sodium chloride 75 mL/hr at 01/07/15 2355  . diltiazem (CARDIZEM) infusion 15 mg/hr (01/08/15 0625)    Labs:  Recent Labs  01/06/15 0353 01/07/15 0442  NA 140 143  K 5.6* 5.3*  CL 112* 115*  CO2 21* 21*  GLUCOSE 134* 120*  BUN 56* 69*  CREATININE 3.84* 4.41*  CALCIUM 8.4* 8.4*   No results for input(s): AST, ALT, ALKPHOS, BILITOT, PROT, ALBUMIN in the last 72 hours.  Recent Labs  01/07/15 0442 01/08/15 0310  WBC 14.0* 10.9*  HGB 8.3* 8.9*  HCT 25.5* 27.1*  MCV 89.6 90.4  PLT 171 169   No results for input(s): CKTOTAL, CKMB, TROPONINI in the last 72 hours. Invalid input(s): POCBNP No results for input(s): HGBA1C in the last 72 hours.   Weights: Filed Weights   01/06/15 0455 01/07/15 0500 01/08/15 0547  Weight: 134 lb 4.2 oz (60.9 kg) 133 lb 13.1 oz (60.7 kg) 128 lb 8.5 oz (58.3 kg)     Radiology/Studies:  Dg Chest 1 View  01/05/2015   CLINICAL DATA:  Increasing shortness of Breath following femoral rod placement  EXAM: CHEST  1 VIEW  COMPARISON:  01/04/2015  FINDINGS: Cardiac  shadow is within normal limits. Postoperative changes are again seen. Aortic calcifications are again noted. The lungs are well aerated bilaterally with some chronic interstitial change. No focal abnormality is seen.  IMPRESSION: No active disease.   Electronically Signed   By: Inez Catalina M.D.   On: 01/05/2015 11:45   Dg Chest 1 View  01/04/2015   CLINICAL DATA:  Right hip and left hip pain. Unwitnessed fall. Patient on Coumadin. Preoperative study.  EXAM: CHEST  1 VIEW  COMPARISON:  11/03/2014  FINDINGS: Postoperative changes in the  mediastinum. Heart size and pulmonary vascularity are normal. No focal airspace disease or consolidation. No pneumothorax. Calcified and tortuous aorta.  IMPRESSION: No active disease.   Electronically Signed   By: Lucienne Capers M.D.   On: 01/04/2015 03:12   Ct Head Wo Contrast  01/04/2015   CLINICAL DATA:  Fall with loss of consciousness. Slip on hardwood floor. On Coumadin with history of dementia.  EXAM: CT HEAD WITHOUT CONTRAST  TECHNIQUE: Contiguous axial images were obtained from the base of the skull through the vertex without intravenous contrast.  COMPARISON:  11/21/2014  FINDINGS: Unchanged cerebral atrophy. Unchanged chronic small vessel ischemic change.No intracranial hemorrhage, mass effect, or midline shift. No hydrocephalus. The basilar cisterns are patent. No evidence of territorial infarct. No intracranial fluid collection. Calvarium is intact. Included paranasal sinuses and mastoid air cells are well aerated.  IMPRESSION: Stable atrophy and chronic small vessel ischemic change without acute intracranial abnormality.   Electronically Signed   By: Jeb Levering M.D.   On: 01/04/2015 02:55   Dg Chest Port 1 View  01/06/2015   CLINICAL DATA:  81 year old male with acute respiratory failure. History of coronary artery disease.  EXAM: PORTABLE CHEST - 1 VIEW  COMPARISON:  Chest x-ray 01/05/2015.  FINDINGS: Lung volumes are slightly low. Airspace consolidation in the right lower lobe. Small right pleural effusion. Mild patchy interstitial prominence in the lungs bilaterally, similar to prior examinations, favored to reflect areas of scarring. Emphysematous changes. No evidence of pulmonary edema. Heart size is borderline enlarged upper mediastinal contours are within normal limits. Atherosclerosis in the thoracic aorta. Status post median sternotomy.  IMPRESSION: 1. Right lower lobe consolidation concerning for pneumonia, with small right parapneumonic pleural effusion. 2. Chronic lung  changes, including emphysema, redemonstrated, as above. 3. Atherosclerosis.   Electronically Signed   By: Vinnie Langton M.D.   On: 01/06/2015 13:55   Dg Hip Operative Unilat With Pelvis Right  01/05/2015   CLINICAL DATA:  Elective surgery.  EXAM: OPERATIVE right HIP (WITH PELVIS IF PERFORMED) 2 VIEWS  TECHNIQUE: Fluoroscopic spot image(s) were submitted for interpretation post-operatively.  COMPARISON:  Pelvis radiography 01/04/2015  FINDINGS: Intraoperative fluoroscopy shows placement of a femoral nail with dynamic femoral neck screw for treatment of a intertrochanteric and subtrochanteric right femur fracture. No evidence of intraoperative fracture or dislocation.  IMPRESSION: Fluoroscopy for proximal femur fracture fixation.   Electronically Signed   By: Monte Fantasia M.D.   On: 01/05/2015 09:30   Dg Hip Unilat With Pelvis 2-3 Views Right  01/04/2015   CLINICAL DATA:  Right-sided hip pain after slip and fall.  EXAM: DG HIP (WITH OR WITHOUT PELVIS) 2-3V RIGHT  COMPARISON:  None.  FINDINGS: Primarily oblique fracture of the right proximal femur with intertrochanteric and subtrochanteric components. Minimal displacement. No significant angulation. Remainder the bony pelvis and left hip are intact. There are dense atherosclerotic calcifications of the pelvic vasculature. Tacks from bilateral hernia repairs noted.  IMPRESSION: Right proximal femur fracture with mild displacement involving the intertrochanteric and subtrochanteric femur.   Electronically Signed   By: Jeb Levering M.D.   On: 01/04/2015 03:13   Dg Femur Port, Min 2 Views Right  01/05/2015   CLINICAL DATA:  Status post medullary rod placement  EXAM: RIGHT FEMUR PORTABLE 1 VIEW  COMPARISON:  None.  FINDINGS: Medullary rod is noted. The proximal femoral fracture is reduced. A fixation screw is noted both proximally and distally. Heavy vascular calcifications are noted. No acute bony abnormality or soft tissue abnormality is seen.   IMPRESSION: Status post ORIF of proximal right femoral fracture.   Electronically Signed   By: Inez Catalina M.D.   On: 01/05/2015 10:10     Assessment and Recommendation  81 y.o. male with acute respiratory failure likely secondary to aspiration and/or hypoxia with a syncopal episode and femur fracture and known coronary artery disease without evidence of current myocardial infarction but continued atrial flutter with rapid ventricular rate with a reasonable heart rate control due to improvements of illness and recent fall and continued use of diltiazem drip 1. Continue diltiazem drip for heart rate control and blood pressure control  2. Anticoagulation if able if not causing anemia to reduce risk of stroke and deep venous thrombosis 3. Continue aggressive pulmonary treatment and care which appears to be the primary issue which is improving slowly 4. Possible addition of oral medication management for heart rate control and/or use of amiodarone if patient continues to have significant high heart rate and begins to affect his overall outcome but currently would abstain  Signed, Serafina Royals M.D. FACC

## 2015-01-08 NOTE — Clinical Social Work Placement (Signed)
   CLINICAL SOCIAL WORK PLACEMENT  NOTE  Date:  01/08/2015  Patient Details  Name: CHAYTON MURATA MRN: [id number] Date of Birth: [birth year]  Clinical Social Work is seeking post-discharge placement for this patient at the [place] level of care (*CSW will initial, date and re-position this form in  chart as items are completed):  Yes   Patient/family provided with [place] [facility] Work Department's list of facilities offering this level of care within the geographic area requested by the patient (or if unable, by the patient's family).  Yes   Patient/family informed of their freedom to choose among providers that offer the needed level of care, that participate in Medicare, Medicaid or managed care program needed by the patient, have an available bed and are willing to accept the patient.  Yes   Patient/family informed of [facility]'s ownership interest in [facility] and [facility], as well as of the fact that they are under no obligation to receive care at these facilities.  PASRR submitted to EDS on       PASRR number received on       Existing PASRR number confirmed on 01/04/15     FL2 transmitted to all facilities in geographic area requested by pt/family on 01/04/15     FL2 transmitted to all facilities within larger geographic area on       Patient informed that his/her managed care company has contracts with or will negotiate with certain facilities, including the following:        Yes   Patient/family informed of bed offers received.  Patient chooses bed at [facility]     Physician recommends and patient chooses bed at      Patient to be transferred to   on  .  Patient to be transferred to facility by       Patient family notified on   of transfer.  Name of family member notified:        PHYSICIAN Please sign FL2     Additional Comment:    _______________________________________________ Drema Balzarine D,  LCSW 01/08/2015, 11:09 AM

## 2015-01-08 NOTE — Progress Notes (Addendum)
Patient afib on monitor- rate controlled with Cardizem drip at 5mg s- Patient given haldol and morphine- around the clock to help with agitation- patient on 4 liters of oxygen at this time- Pallative consult placed.

## 2015-01-08 NOTE — Progress Notes (Signed)
Palliative Care Update  Pallitiave Care Consult is initiated.   Pt appears to be quite restless and in pain.  Morphine was increased just slightly for symptom management after talking with nurse and reviewing his chart and examining pt.   He is quite tachycardic and Cardizem drip is being adjusted.  He is floridly confused and agitated.     More to follow. See full note to follow this visit.       Kirby Funk, MD

## 2015-01-08 NOTE — Consult Note (Signed)
Palliative Medicine Inpatient Consult Note   Name: Derek [name] Date: 01/08/2015 MRN: [id number]  DOB: 26-Apr-1931  Referring Physician: [name], MD  Palliative Care consult requested for this 81 y.o. male for goals of medical therapy in patient with delerium, aspiration pneumonia, and acute on chronic renal failure following fall with hip fracture and right hip repair.  PLAN: I was unable to spend time talking with family today.  Pt is quite delerious and management of his AFib RVR is also creating a challenge for nursing.  I learned some information about pt and will follow up on this tomorrow by talking with family.  Goals of care and possible Hospice will be brought up. He is DNR status.     REVIEW OF SYSTEMS:  Pt is too confused to give ROS.  SPIRITUAL SUPPORT SYSTEM: Yes --patients daughters are very supportive per report..  SOCIAL HISTORY:  reports that he has quit smoking. He does not have any smokeless tobacco history on file. He reports that he does not drink alcohol or use illicit drugs.  He lives with his daughter, Candie Chroman, who has recently moved from [facility] to [place] just so pt could have a one level home and his own room etc.  Pt has 24/7 care that is provided by a variety of people:  Jill's husband is a disabled veteran and he is there for part of the time. They pay for sitters for part of each day.  Then, Sharee Pimple is there at night.  Jill's daughters are not too far away in [place], if they are needed.  Sharee Pimple works as Marine scientist at [employer] in a pediatric post sedation unit.   Pt likes being at this home but had recently been accepted at the day program at [employer] b/c 'he gets bored'.  Pts other daughter, Sherron Ales, is also a Marine scientist and she lives in [place].   She takes care of the financial matters.  She will be here visiting soon, per report.   LEGAL DOCUMENTS:  Armandina Gemma DNR form is signed by me and in hard chart.  CODE STATUS: DNR  PAST MEDICAL  HISTORY: Past Medical History  Diagnosis Date  . Undiagnosed cardiac murmurs   . CAD (coronary artery disease)   . Other and unspecified hyperlipidemia   . Unspecified essential hypertension   . Diaphragmatic hernia without mention of obstruction or gangrene   . Sensory hearing loss, unilateral   . Colon polyps   . Impaired fasting glucose   . Esophageal reflux   . Elevated prostate specific antigen (PSA)   . Unspecified disorder resulting from impaired renal function   . Unspecified disorder of kidney and ureter   . Alzheimer's dementia     PAST SURGICAL HISTORY:  Past Surgical History  Procedure Laterality Date  . Polyp removal  1991  . Hop r/o  1994    HTN past mild MI Dr. Gwenlyn Found  . Stress cardiolite  4/99    nml. Dr. Percival Spanish. no change when done 02/06/03  . Colonoscopy  10/99    no polyps. Dr. Vira Agar  . Esophagogastroduodenoscopy  08/15/02    hh erosions of stomah. Dr. Vira Agar  . Hernia repair  05/11/03  . Esophagogastroduodenoscopy  08/15/02    hh erosive gastropathy- duodenal mass   . Cystoscopy  5/07     thickening of bladder wall; other wise wnl  . Femur im nail Right 01/05/2015    Procedure: INTRAMEDULLARY (IM) NAIL FEMORAL;  Surgeon: Thornton Park, MD;  Location: [facility] ORS;  Service: Orthopedics;  Laterality: Right;    ALLERGIES:  is allergic to pradaxa.  MEDICATIONS:  Current Facility-Administered Medications  Medication Dose Route Frequency Provider Last Rate Last Dose  . 0.45 % sodium chloride infusion   Intravenous Continuous Murlean Iba, MD 75 mL/hr at 01/08/15 1336    . acetaminophen (TYLENOL) tablet 1,000 mg  1,000 mg Oral Q6H PRN Harrie Foreman, MD      . alum & mag hydroxide-simeth (MAALOX/MYLANTA) 200-200-20 MG/5ML suspension 30 mL  30 mL Oral Q4H PRN Thornton Park, MD      . bisacodyl (DULCOLAX) suppository 10 mg  10 mg Rectal Daily PRN Thornton Park, MD      . clonazePAM Bobbye Charleston) tablet 0.5 mg  0.5 mg Oral BID Harrie Foreman, MD   0.5 mg  at 01/04/15 2111  . diltiazem (CARDIZEM CD) 24 hr capsule 120 mg  120 mg Oral BID Harrie Foreman, MD   120 mg at 01/04/15 2111  . diltiazem (CARDIZEM) 100 mg in dextrose 5 % 100 mL (1 mg/mL) infusion  5-15 mg/hr Intravenous Continuous Bettey Costa, MD 15 mL/hr at 01/08/15 1657    . docusate sodium (COLACE) capsule 100 mg  100 mg Oral BID Harrie Foreman, MD   100 mg at 01/04/15 2111  . ferrous sulfate tablet 325 mg  325 mg Oral TID PC Thornton Park, MD   Stopped at 01/05/15 1800  . finasteride (PROSCAR) tablet 5 mg  5 mg Oral Daily Harrie Foreman, MD   5 mg at 01/04/15 0254  . galantamine (RAZADYNE) tablet 8 mg  8 mg Oral BID WC Harrie Foreman, MD   8 mg at 01/04/15 1811  . haloperidol lactate (HALDOL) injection 5 mg  5 mg Intravenous Q6H PRN [name], MD   5 mg at 01/08/15 1406  . heparin injection 5,000 Units  5,000 Units Subcutaneous Q12H Flora Lipps, MD   5,000 Units at 01/08/15 1452  . HYDROcodone-acetaminophen (NORCO/VICODIN) 5-325 MG per tablet 1-2 tablet  1-2 tablet Oral Q6H PRN Thornton Park, MD      . Influenza vac split quadrivalent PF (FLUARIX) injection 0.5 mL  0.5 mL Intramuscular Tomorrow-1000 Harrie Foreman, MD      . menthol-cetylpyridinium (CEPACOL) lozenge 3 mg  1 lozenge Oral PRN Thornton Park, MD       Or  . phenol (CHLORASEPTIC) mouth spray 1 spray  1 spray Mouth/Throat PRN Thornton Park, MD      . morphine 2 MG/ML injection 2 mg  2 mg Intravenous Q4H PRN Colleen Can, MD      . morphine 4 MG/ML injection 3 mg  3 mg Intravenous Q4H PRN Colleen Can, MD      . ondansetron [facility]) tablet 4 mg  4 mg Oral Q6H PRN Harrie Foreman, MD       Or  . ondansetron [facility]) injection 4 mg  4 mg Intravenous Q6H PRN Harrie Foreman, MD      . pantoprazole (PROTONIX) injection 40 mg  40 mg Intravenous Q12H [name], MD   40 mg at 01/08/15 0933  . PARoxetine (PAXIL) tablet 10 mg  10 mg Oral BH-q7a Harrie Foreman, MD   10 mg at 01/04/15 0934   . piperacillin-tazobactam (ZOSYN) IVPB 3.375 g  3.375 g Intravenous Q12H [name], MD   3.375 g at 01/08/15 0932  . polyethylene glycol (MIRALAX / GLYCOLAX) packet 17 g  17  g Oral Daily PRN Thornton Park, MD      . sodium chloride 0.9 % injection 3 mL  3 mL Intravenous Q12H Harrie Foreman, MD   3 mL at 01/06/15 1310  . tamsulosin (FLOMAX) capsule 0.4 mg  0.4 mg Oral Daily Harrie Foreman, MD   0.4 mg at 01/04/15 8416  . vancomycin (VANCOCIN) IVPB 1000 mg/200 mL premix  1,000 mg Intravenous Q48H [name], MD   1,000 mg at 01/08/15 6063    Vital Signs: BP 126/63 mmHg  Pulse 71  Temp(Src) 99.4 F (37.4 C) (Axillary)  Resp 15  Ht 5\' 6"  (1.676 m)  Wt 58.3 kg (128 lb 8.5 oz)  BMI 20.75 kg/m2  SpO2 96% Filed Weights   01/06/15 0455 01/07/15 0500 01/08/15 0547  Weight: 60.9 kg (134 lb 4.2 oz) 60.7 kg (133 lb 13.1 oz) 58.3 kg (128 lb 8.5 oz)    Estimated body mass index is 20.75 kg/(m^2) as calculated from the following:   Height as of this encounter: 5\' 6"  (1.676 m).   Weight as of this encounter: 58.3 kg (128 lb 8.5 oz).  PERFORMANCE STATUS (ECOG) : 4 - Bedbound now --post op hip fx  PHYSICAL EXAM: [place] agitated at time of my exam EOMI Talking but not making sense Op clear Neck w/o JVD or TM Hrt rapid Afib on tele at rate 120 - 140 Lungs with decreased BS bases bilat Abd soft and NT Skin no mottling Tender right hip surgical hip repair site Confused and agitated.   IMPRESSION: 1.  Right hip fracture following fall due to poor safety awareness  ---s/p hip repair on 8/26 ---seems to be having some ongoing pain 2.  Alzheimer's Dementia. ---He also has Primary Progressive Aphasia (an atypical presentation of Alzheimers in his case) 3.  CAD ---He had a CABG  4.  Chronic renal failure with creatinine about 3.5 or so prior to admission (precise # unclear) 5. Acute renal failure 6.  AFib/ Flutter Rapid Vent response ---on Cardizem drip 7.  BPH On  Finasteride 8.  GERD on PPI 9.  Undiagnoses heart murmurs?  10.  HTN   See Above for plan   More than 50% of the visit was spent in counseling/coordination of care: Yes  Time Spent: 60 minutes

## 2015-01-08 NOTE — Care Management Note (Signed)
Case Management Note  Patient Details  Name: Derek [name] MRN: [id number] Date of Birth: August 29, 1930  Subjective/Objective:   Right femoral fracture with surgery on 8/26.              Action/Plan: Jeani Hawking, CSW made aware that patient will need SNF at discharge.                   Expected Discharge Date:  01/08/15               Expected Discharge Plan:     In-House Referral:     Discharge planning Services  CM Consult  Post Acute Care Choice:  Home Health, Durable Medical Equipment Choice offered to:  Adult Children  DME Arranged:    DME Agency:     HH Arranged:    HH Agency:     Status of Service:  In process, will continue to follow  Medicare Important Message Given:    Date Medicare IM Given:    Medicare IM give by:    Date Additional Medicare IM Given:    Additional Medicare Important Message give by:     If discussed at [place] of Stay Meetings, dates discussed:    Additional Comments:  Jolly Mango, RN 01/08/2015, 9:50 AM

## 2015-01-08 NOTE — Care Management Important Message (Signed)
Important Message  Patient Details  Name: Derek [name] MRN: [id number] Date of Birth: [birth year]   Medicare Important Message Given:  Yes-second notification given    [name] 01/08/2015, 2:55 PM

## 2015-01-08 NOTE — Progress Notes (Signed)
OT Cancellation Note  Patient Details Name: Derek [name] MRN: [id number] DOB: [birth year]   Cancelled Treatment:    Reason Eval/Treat Not Completed: Patient not medically ready with tachycardia.  Sharon Mt 01/08/2015, 11:02 AM

## 2015-01-08 NOTE — Progress Notes (Signed)
Patient ID: Derek [name], male   DOB: [birth year], 81 y.o.   MRN: [id number] Graydon Amesquita is a 81 y.o. male   SUBJECTIVE: Patient suffered  fracturing left proximal femur.  Had surgery 8/26 but decompensated witth hypoxia, A flutter , requiring NRBM Transferred to step down.  CXR negative initially but then RLL consolidation and WBC spiked confirming likely aspiration  Patient continues with significant delirium and agitation with no by mouth intake  ______________________________________________________________________  ROS: Review of systems is unremarkable for any active cardiac,respiratory, GI, GU, hematologic, neurologic or psychiatric systems, 10 systems reviewed.  . clonazePAM  0.5 mg Oral BID  . diltiazem  120 mg Oral BID  . docusate sodium  100 mg Oral BID  . enoxaparin (LOVENOX) injection  30 mg Subcutaneous Q24H  . ferrous sulfate  325 mg Oral TID PC  . finasteride  5 mg Oral Daily  . galantamine  8 mg Oral BID WC  . Influenza vac split quadrivalent PF  0.5 mL Intramuscular Tomorrow-1000  . pantoprazole (PROTONIX) IV  40 mg Intravenous Q12H  . PARoxetine  10 mg Oral BH-q7a  . piperacillin-tazobactam (ZOSYN)  IV  3.375 g Intravenous Q12H  . sodium chloride  3 mL Intravenous Q12H  . tamsulosin  0.4 mg Oral Daily  . vancomycin  1,000 mg Intravenous Q48H   acetaminophen, alum & mag hydroxide-simeth, bisacodyl, haloperidol lactate, HYDROcodone-acetaminophen, menthol-cetylpyridinium **OR** phenol, morphine injection, ondansetron **OR** ondansetron (ZOFRAN) IV, polyethylene glycol   Past Medical History  Diagnosis Date  . Undiagnosed cardiac murmurs   . CAD (coronary artery disease)   . Other and unspecified hyperlipidemia   . Unspecified essential hypertension   . Diaphragmatic hernia without mention of obstruction or gangrene   . Sensory hearing loss, unilateral   . Colon polyps   . Impaired fasting glucose   . Esophageal reflux   . Elevated prostate specific antigen  (PSA)   . Unspecified disorder resulting from impaired renal function   . Unspecified disorder of kidney and ureter   . Alzheimer's dementia     Past Surgical History  Procedure Laterality Date  . Polyp removal  1991  . Hop r/o  1994    HTN past mild MI Dr. Gwenlyn Found  . Stress cardiolite  4/99    nml. Dr. Percival Spanish. no change when done 02/06/03  . Colonoscopy  10/99    no polyps. Dr. Vira Agar  . Esophagogastroduodenoscopy  08/15/02    hh erosions of stomah. Dr. Vira Agar  . Hernia repair  05/11/03  . Esophagogastroduodenoscopy  08/15/02    hh erosive gastropathy- duodenal mass   . Cystoscopy  5/07     thickening of bladder wall; other wise wnl    PHYSICAL EXAM:  BP 159/94 mmHg  Pulse 77  Temp(Src) 98.3 F (36.8 C) (Axillary)  Resp 17  Ht 5\' 6"  (1.676 m)  Wt 58.3 kg (128 lb 8.5 oz)  BMI 20.75 kg/m2  SpO2 97%  Wt Readings from Last 3 Encounters:  01/08/15 58.3 kg (128 lb 8.5 oz)  11/21/14 65.772 kg (145 lb)  11/03/14 58.968 kg (130 lb)           BP Readings from Last 3 Encounters:  01/08/15 159/94  11/22/14 185/99  11/04/14 166/67   Constitutional: ill appearing Neck: supple, no thyromegaly Respiratory: bilateral rhonchi Cardiovascular: IRR, tachy Abdomen: soft, nml BS, nontender Extremities: no edema, moderate right anterior tenderness Neuro: Alert, agitated   Lab Results  Component Value Date   CREATININE 4.41* 01/07/2015  BUN 69* 01/07/2015   NA 143 01/07/2015   K 5.3* 01/07/2015   CL 115* 01/07/2015   CO2 21* 01/07/2015   Lab Results  Component Value Date   WBC 10.9* 01/08/2015   HGB 8.9* 01/08/2015   HCT 27.1* 01/08/2015   MCV 90.4 01/08/2015   PLT 169 01/08/2015   Dg Chest 1 View  01/05/2015   CLINICAL DATA:  Increasing shortness of Breath following femoral rod placement  EXAM: CHEST  1 VIEW  COMPARISON:  01/04/2015  FINDINGS: Cardiac shadow is within normal limits. Postoperative changes are again seen. Aortic calcifications are again noted. The lungs  are well aerated bilaterally with some chronic interstitial change. No focal abnormality is seen.  IMPRESSION: No active disease.   Electronically Signed   By: Inez Catalina M.D.   On: 01/05/2015 11:45   Dg Chest 1 View  01/04/2015   CLINICAL DATA:  Right hip and left hip pain. Unwitnessed fall. Patient on Coumadin. Preoperative study.  EXAM: CHEST  1 VIEW  COMPARISON:  11/03/2014  FINDINGS: Postoperative changes in the mediastinum. Heart size and pulmonary vascularity are normal. No focal airspace disease or consolidation. No pneumothorax. Calcified and tortuous aorta.  IMPRESSION: No active disease.   Electronically Signed   By: Lucienne Capers M.D.   On: 01/04/2015 03:12   Ct Head Wo Contrast  01/04/2015   CLINICAL DATA:  Fall with loss of consciousness. Slip on hardwood floor. On Coumadin with history of dementia.  EXAM: CT HEAD WITHOUT CONTRAST  TECHNIQUE: Contiguous axial images were obtained from the base of the skull through the vertex without intravenous contrast.  COMPARISON:  11/21/2014  FINDINGS: Unchanged cerebral atrophy. Unchanged chronic small vessel ischemic change.No intracranial hemorrhage, mass effect, or midline shift. No hydrocephalus. The basilar cisterns are patent. No evidence of territorial infarct. No intracranial fluid collection. Calvarium is intact. Included paranasal sinuses and mastoid air cells are well aerated.  IMPRESSION: Stable atrophy and chronic small vessel ischemic change without acute intracranial abnormality.   Electronically Signed   By: Jeb Levering M.D.   On: 01/04/2015 02:55   Dg Chest Port 1 View  01/06/2015   CLINICAL DATA:  81 year old male with acute respiratory failure. History of coronary artery disease.  EXAM: PORTABLE CHEST - 1 VIEW  COMPARISON:  Chest x-ray 01/05/2015.  FINDINGS: Lung volumes are slightly low. Airspace consolidation in the right lower lobe. Small right pleural effusion. Mild patchy interstitial prominence in the lungs bilaterally,  similar to prior examinations, favored to reflect areas of scarring. Emphysematous changes. No evidence of pulmonary edema. Heart size is borderline enlarged upper mediastinal contours are within normal limits. Atherosclerosis in the thoracic aorta. Status post median sternotomy.  IMPRESSION: 1. Right lower lobe consolidation concerning for pneumonia, with small right parapneumonic pleural effusion. 2. Chronic lung changes, including emphysema, redemonstrated, as above. 3. Atherosclerosis.   Electronically Signed   By: Vinnie Langton M.D.   On: 01/06/2015 13:55   Dg Hip Operative Unilat With Pelvis Right  01/05/2015   CLINICAL DATA:  Elective surgery.  EXAM: OPERATIVE right HIP (WITH PELVIS IF PERFORMED) 2 VIEWS  TECHNIQUE: Fluoroscopic spot image(s) were submitted for interpretation post-operatively.  COMPARISON:  Pelvis radiography 01/04/2015  FINDINGS: Intraoperative fluoroscopy shows placement of a femoral nail with dynamic femoral neck screw for treatment of a intertrochanteric and subtrochanteric right femur fracture. No evidence of intraoperative fracture or dislocation.  IMPRESSION: Fluoroscopy for proximal femur fracture fixation.   Electronically Signed   By: Angelica Chessman  Watts M.D.   On: 01/05/2015 09:30   Dg Hip Unilat With Pelvis 2-3 Views Right  01/04/2015   CLINICAL DATA:  Right-sided hip pain after slip and fall.  EXAM: DG HIP (WITH OR WITHOUT PELVIS) 2-3V RIGHT  COMPARISON:  None.  FINDINGS: Primarily oblique fracture of the right proximal femur with intertrochanteric and subtrochanteric components. Minimal displacement. No significant angulation. Remainder the bony pelvis and left hip are intact. There are dense atherosclerotic calcifications of the pelvic vasculature. Tacks from bilateral hernia repairs noted.  IMPRESSION: Right proximal femur fracture with mild displacement involving the intertrochanteric and subtrochanteric femur.   Electronically Signed   By: Jeb Levering M.D.   On:  01/04/2015 03:13   Dg Femur Port, Min 2 Views Right  01/05/2015   CLINICAL DATA:  Status post medullary rod placement  EXAM: RIGHT FEMUR PORTABLE 1 VIEW  COMPARISON:  None.  FINDINGS: Medullary rod is noted. The proximal femoral fracture is reduced. A fixation screw is noted both proximally and distally. Heavy vascular calcifications are noted. No acute bony abnormality or soft tissue abnormality is seen.  IMPRESSION: Status post ORIF of proximal right femoral fracture.   Electronically Signed   By: Inez Catalina M.D.   On: 01/05/2015 10:10    ASSESSMENT/PLAN: Resp failure- aspiration pneumonia, continue  Zosyn and vancomycin, try to wean O2  A flutter with RVR - on dilt drip- HR improved but not taking po -  Cardiology help appreciated, off Coumadin  Femoral fracture-orthopedic evaluation - s/p surgery  holding Coumadin but may restart once stable per cards and ortho - will start lovenox for post op ppx per Dr Sabra Heck  Progressive aphasia-associated dementia associated with delirium, galantamine and twice a day Klonopin, with agitation will try higher dose Haldol, avoid Ativan  CKD4-baseline creatinine 3.7 with associated anemia, worse at, follow closely, gentle hydration  Overall poor prognosis, will need skilled nursing Pt is DNR

## 2015-01-08 NOTE — Progress Notes (Signed)
Pharmacy Consult for Vancomycin/Zosyn Indication: PNA  Allergies  Allergen Reactions  . Pradaxa [Dabigatran Etexilate Mesylate] Other (See Comments)    Patient Measurements: Height: 5\' 6"  (167.6 cm) Weight: 128 lb 8.5 oz (58.3 kg) IBW/kg (Calculated) : 63.8   Vital Signs: Temp: 97.8 F (36.6 C) (08/29 0800) Temp Source: Axillary (08/29 0800) BP: 158/75 mmHg (08/29 0700) Pulse Rate: 148 (08/29 0700) Intake/Output from previous day: 08/28 0701 - 08/29 0700 In: 2081.9 [I.V.:1781.9; IV Piggyback:300] Out: 992 [Urine:992] Intake/Output from this shift: Total I/O In: 80 [I.V.:80] Out: 1 [Urine:1]  Labs:  Recent Labs  01/06/15 0353 01/07/15 0442 01/08/15 0310  WBC 21.4* 14.0* 10.9*  HGB 9.8* 8.3* 8.9*  PLT 202 171 169  CREATININE 3.84* 4.41* 4.33*   Estimated Creatinine Clearance: 11 mL/min (by C-G formula based on Cr of 4.33). No results for input(s): VANCOTROUGH, VANCOPEAK, VANCORANDOM, GENTTROUGH, GENTPEAK, GENTRANDOM, TOBRATROUGH, TOBRAPEAK, TOBRARND, AMIKACINPEAK, AMIKACINTROU, AMIKACIN in the last 72 hours.   Microbiology: Recent Results (from the past 720 hour(s))  Surgical pcr screen     Status: None   Collection Time: 01/04/15 10:58 AM  Result Value Ref Range Status   MRSA, PCR NEGATIVE NEGATIVE Final   Staphylococcus aureus NEGATIVE NEGATIVE Final    Comment:        The Xpert SA Assay (FDA approved for NASAL specimens in patients over 89 years of age), is one component of a comprehensive surveillance program.  Test performance has been validated by [facility] for patients greater than or equal to 61 year old. It is not intended to diagnose infection nor to guide or monitor treatment.   MRSA PCR Screening     Status: None   Collection Time: 01/05/15  1:55 PM  Result Value Ref Range Status   MRSA by PCR NEGATIVE NEGATIVE Final    Comment:        The GeneXpert MRSA Assay (FDA approved for NASAL specimens only), is one component of  a comprehensive MRSA colonization surveillance program. It is not intended to diagnose MRSA infection nor to guide or monitor treatment for MRSA infections.     Medical History: Past Medical History  Diagnosis Date  . Undiagnosed cardiac murmurs   . CAD (coronary artery disease)   . Other and unspecified hyperlipidemia   . Unspecified essential hypertension   . Diaphragmatic hernia without mention of obstruction or gangrene   . Sensory hearing loss, unilateral   . Colon polyps   . Impaired fasting glucose   . Esophageal reflux   . Elevated prostate specific antigen (PSA)   . Unspecified disorder resulting from impaired renal function   . Unspecified disorder of kidney and ureter   . Alzheimer's dementia     Medications:  Scheduled:  . clonazePAM  0.5 mg Oral BID  . diltiazem  120 mg Oral BID  . docusate sodium  100 mg Oral BID  . ferrous sulfate  325 mg Oral TID PC  . finasteride  5 mg Oral Daily  . galantamine  8 mg Oral BID WC  . heparin subcutaneous  5,000 Units Subcutaneous Q12H  . Influenza vac split quadrivalent PF  0.5 mL Intramuscular Tomorrow-1000  . pantoprazole (PROTONIX) IV  40 mg Intravenous Q12H  . PARoxetine  10 mg Oral BH-q7a  . piperacillin-tazobactam (ZOSYN)  IV  3.375 g Intravenous Q12H  . sodium chloride  3 mL Intravenous Q12H  . tamsulosin  0.4 mg Oral Daily  . vancomycin  1,000 mg Intravenous Q48H   Infusions:  .  sodium chloride 75 mL/hr at 01/07/15 2355  . diltiazem (CARDIZEM) infusion 5 mg/hr (01/08/15 0800)   PRN: acetaminophen, alum & mag hydroxide-simeth, bisacodyl, haloperidol lactate, HYDROcodone-acetaminophen, menthol-cetylpyridinium **OR** phenol, morphine injection, ondansetron **OR** ondansetron (ZOFRAN) IV, polyethylene glycol  Assessment: 81 y/o M s/p femur fracture repair who developed aflutter and later aspirated to begin empiric abx. Patient with CKD on day 4 of abx.   Goal of Therapy:  Vancomycin trough level 15-20  mcg/ml  Plan:  1. Will continue vancomycin 1000 mg vi q 48 hours and check a trough with the third dose on 08/31.   2. Zosyn 3.375 g EI q 12 hours.   Ulice Dash D 01/08/2015,12:41 PM

## 2015-01-08 NOTE — Progress Notes (Signed)
PT Cancellation Note  Patient Details Name: MICK TANGUMA MRN: [id number] DOB: 03-12-1931   Cancelled Treatment:    Reason Eval/Treat Not Completed:  (See PT note for further details ) Per chart review and communication with nurse, PT will hold therapy this AM secondary to tachycardia and pt agitation. Nurse in agreement with current hold and later attempt.    Janyth Contes 01/08/2015, 10:53 AM  Janyth Contes, SPT. [phone]

## 2015-01-09 DIAGNOSIS — R0602 Shortness of breath: Secondary | ICD-10-CM | POA: Insufficient documentation

## 2015-01-09 DIAGNOSIS — N170 Acute kidney failure with tubular necrosis: Secondary | ICD-10-CM | POA: Insufficient documentation

## 2015-01-09 LAB — CBC WITH DIFFERENTIAL/PLATELET
BASOS PCT: 1 %
Basophils Absolute: 0 10*3/uL (ref 0–0.1)
EOS ABS: 0.2 10*3/uL (ref 0–0.7)
EOS PCT: 3 %
HCT: 24.5 % — ABNORMAL LOW (ref 40.0–52.0)
HEMOGLOBIN: 8 g/dL — AB (ref 13.0–18.0)
LYMPHS ABS: 0.4 10*3/uL — AB (ref 1.0–3.6)
Lymphocytes Relative: 6 %
MCH: 29.7 pg (ref 26.0–34.0)
MCHC: 32.5 g/dL (ref 32.0–36.0)
MCV: 91.2 fL (ref 80.0–100.0)
MONO ABS: 0.8 10*3/uL (ref 0.2–1.0)
MONOS PCT: 10 %
Neutro Abs: 6.6 10*3/uL — ABNORMAL HIGH (ref 1.4–6.5)
Neutrophils Relative %: 80 %
PLATELETS: 183 10*3/uL (ref 150–440)
RBC: 2.69 MIL/uL — ABNORMAL LOW (ref 4.40–5.90)
RDW: 14.9 % — AB (ref 11.5–14.5)
WBC: 8.1 10*3/uL (ref 3.8–10.6)

## 2015-01-09 LAB — URINALYSIS COMPLETE WITH MICROSCOPIC (ARMC ONLY)
Bilirubin Urine: NEGATIVE
GLUCOSE, UA: NEGATIVE mg/dL
Ketones, ur: NEGATIVE mg/dL
LEUKOCYTES UA: NEGATIVE
Nitrite: NEGATIVE
PROTEIN: 100 mg/dL — AB
SPECIFIC GRAVITY, URINE: 1.012 (ref 1.005–1.030)
pH: 5 (ref 5.0–8.0)

## 2015-01-09 LAB — COMPREHENSIVE METABOLIC PANEL
ALK PHOS: 46 U/L (ref 38–126)
ALT: <5 U/L — ABNORMAL LOW (ref 17–63)
ANION GAP: 11 (ref 5–15)
AST: 25 U/L (ref 15–41)
Albumin: 2.4 g/dL — ABNORMAL LOW (ref 3.5–5.0)
BUN: 84 mg/dL — ABNORMAL HIGH (ref 6–20)
CALCIUM: 8.1 mg/dL — AB (ref 8.9–10.3)
CO2: 18 mmol/L — AB (ref 22–32)
Chloride: 113 mmol/L — ABNORMAL HIGH (ref 101–111)
Creatinine, Ser: 4.88 mg/dL — ABNORMAL HIGH (ref 0.61–1.24)
GFR calc non Af Amer: 10 mL/min — ABNORMAL LOW (ref >60)
GFR, EST AFRICAN AMERICAN: 12 mL/min — AB (ref >60)
Glucose, Bld: 95 mg/dL (ref 65–99)
POTASSIUM: 4.8 mmol/L (ref 3.5–5.1)
SODIUM: 142 mmol/L (ref 135–145)
Total Bilirubin: 0.6 mg/dL (ref 0.3–1.2)
Total Protein: 5.7 g/dL — ABNORMAL LOW (ref 6.5–8.1)

## 2015-01-09 MED ORDER — SENNOSIDES-DOCUSATE SODIUM 8.6-50 MG PO TABS
2.0000 | ORAL_TABLET | Freq: Every evening | ORAL | Status: DC | PRN
  Administered 2015-01-09: 2 via ORAL
  Filled 2015-01-09: qty 2

## 2015-01-09 MED ORDER — ACETAMINOPHEN 325 MG PO TABS: 650.0000 mg | ORAL_TABLET | Freq: Four times a day (QID) | ORAL | Status: DC | PRN

## 2015-01-09 MED ORDER — MORPHINE SULFATE (PF) 2 MG/ML IV SOLN
1.0000 mg | INTRAVENOUS | Status: DC | PRN
  Administered 2015-01-09 – 2015-01-10 (×3): 1 mg via INTRAVENOUS
  Filled 2015-01-09 (×3): qty 1

## 2015-01-09 NOTE — Plan of Care (Signed)
Problem: Phase III Progression Outcomes Goal: Activity at appropriate level-compared to baseline (UP IN CHAIR FOR HEMODIALYSIS)  Outcome: Not Progressing Still agitated with any stimulation

## 2015-01-09 NOTE — Progress Notes (Signed)
  Subjective:  Patient is postop day #4 status post intermedullary fixation for right intertrochanteric hip fracture. Patient is sleeping at the time of the exam but awoke during my exam. Patient is agitated and confused and unable to provide a history.   Objective:   VITALS:   Filed Vitals:   01/09/15 0900 01/09/15 1000 01/09/15 1100 01/09/15 1200  BP: 127/64 139/70 152/67 133/64  Pulse: 81 77 82 79  Temp:    98.5 F (36.9 C)  TempSrc:    Axillary  Resp: 12 11 11 12   Height:      Weight:      SpO2: 99% 97% 96% 92%    PHYSICAL EXAM:  Right lower extremity: Unable to assess patient's sensory function. He spontaneously moves the right lower extremity. He has palpable pedal pulses. His leg and thigh compartments are soft and compressible. His incisions are clean dry and intact.   LABS  Results for orders placed or performed during the hospital encounter of 01/04/15 (from the past 24 hour(s))  CBC with Differential/Platelet     Status: Abnormal   Collection Time: 01/09/15  4:57 AM  Result Value Ref Range   WBC 8.1 3.8 - 10.6 K/uL   RBC 2.69 (L) 4.40 - 5.90 MIL/uL   Hemoglobin 8.0 (L) 13.0 - 18.0 g/dL   HCT 24.5 (L) 40.0 - 52.0 %   MCV 91.2 80.0 - 100.0 fL   MCH 29.7 26.0 - 34.0 pg   MCHC 32.5 32.0 - 36.0 g/dL   RDW 14.9 (H) 11.5 - 14.5 %   Platelets 183 150 - 440 K/uL   Neutrophils Relative % 80 %   Neutro Abs 6.6 (H) 1.4 - 6.5 K/uL   Lymphocytes Relative 6 %   Lymphs Abs 0.4 (L) 1.0 - 3.6 K/uL   Monocytes Relative 10 %   Monocytes Absolute 0.8 0.2 - 1.0 K/uL   Eosinophils Relative 3 %   Eosinophils Absolute 0.2 0 - 0.7 K/uL   Basophils Relative 1 %   Basophils Absolute 0.0 0 - 0.1 K/uL  Comprehensive metabolic panel     Status: Abnormal   Collection Time: 01/09/15  4:57 AM  Result Value Ref Range   Sodium 142 135 - 145 mmol/L   Potassium 4.8 3.5 - 5.1 mmol/L   Chloride 113 (H) 101 - 111 mmol/L   CO2 18 (L) 22 - 32 mmol/L   Glucose, Bld 95 65 - 99 mg/dL   BUN 84  (H) 6 - 20 mg/dL   Creatinine, Ser 4.88 (H) 0.61 - 1.24 mg/dL   Calcium 8.1 (L) 8.9 - 10.3 mg/dL   Total Protein 5.7 (L) 6.5 - 8.1 g/dL   Albumin 2.4 (L) 3.5 - 5.0 g/dL   AST 25 15 - 41 U/L   ALT <5 (L) 17 - 63 U/L   Alkaline Phosphatase 46 38 - 126 U/L   Total Bilirubin 0.6 0.3 - 1.2 mg/dL   GFR calc non Af Amer 10 (L) >60 mL/min   GFR calc Af Amer 12 (L) >60 mL/min   Anion gap 11 5 - 15    No results found.  Assessment/Plan: 4 Days Post-Op   Active Problems:   Femur fracture  Patient remains critically ill. Renal failure is worsening. Appreciate renal, cardiology and medicine services following. Awaiting palliative care recommendations. Orthopedically stable. I will continue to follow.    Thornton Park , MD 01/09/2015, 2:29 PM

## 2015-01-09 NOTE — Progress Notes (Signed)
Subjective:  Pt still not following commands at the moment. Renal function worse.  Cr up to 4.88. Pt has also become oliguric. Case discussed with Dr. Sabra Heck this am as well with daughter Jan who lives in [place].  Objective:  Vital signs in last 24 hours:  Temp:  [97.8 F (36.6 C)-99.4 F (37.4 C)] 98.2 F (36.8 C) (08/30 0800) Pulse Rate:  [43-140] 81 (08/30 0900) Resp:  [10-19] 12 (08/30 0900) BP: (102-147)/(55-99) 127/64 mmHg (08/30 0900) SpO2:  [89 %-100 %] 99 % (08/30 0900) Weight:  [60.4 kg (133 lb 2.5 oz)] 60.4 kg (133 lb 2.5 oz) (08/30 0500)  Weight change: 2.1 kg (4 lb 10.1 oz) Filed Weights   01/07/15 0500 01/08/15 0547 01/09/15 0500  Weight: 60.7 kg (133 lb 13.1 oz) 58.3 kg (128 lb 8.5 oz) 60.4 kg (133 lb 2.5 oz)    Intake/Output:    Intake/Output Summary (Last 24 hours) at 01/09/15 0929 Last data filed at 01/09/15 0900  Gross per 24 hour  Intake 977.92 ml  Output      1 ml  Net 976.92 ml     Physical Exam: General:  critically ill-appearing, frail, elderly  HEENT  dry oral mucous membranes, anicteric, face mask for oxygen  Neck  supple  Pulm/lungs  bilateral rhonchi, normal effort  CVS/Heart  irregular, no rubs  Abdomen:   soft, nontender, nondistended  Extremities:  no peripheral edema, right hip surgical bandage  Neurologic:  lethargic, not following commands  Skin:  no acute rashes          Basic Metabolic Panel:   Recent Labs Lab 01/05/15 0646 01/06/15 0353 01/07/15 0442 01/08/15 0310 01/09/15 0457  NA 140 140 143 141 142  K 6.1* 5.6* 5.3* 4.8 4.8  CL 116* 112* 115* 112* 113*  CO2 21* 21* 21* 20* 18*  GLUCOSE 116* 134* 120* 94 95  BUN 52* 56* 69* 74* 84*  CREATININE 3.33* 3.84* 4.41* 4.33* 4.88*  CALCIUM 8.2* 8.4* 8.4* 8.2* 8.1*     CBC:  Recent Labs Lab 01/04/15 0235 01/05/15 0646 01/06/15 0353 01/07/15 0442 01/08/15 0310 01/09/15 0457  WBC 5.6 9.9 21.4* 14.0* 10.9* 8.1  NEUTROABS 4.1 8.4*  --   --   --  6.6*   HGB 8.1* 8.4* 9.8* 8.3* 8.9* 8.0*  HCT 24.9* 25.7* 29.9* 25.5* 27.1* 24.5*  MCV 92.7 93.3 89.3 89.6 90.4 91.2  PLT 204 191 202 171 169 183      Microbiology:  Recent Results (from the past 720 hour(s))  Surgical pcr screen     Status: None   Collection Time: 01/04/15 10:58 AM  Result Value Ref Range Status   MRSA, PCR NEGATIVE NEGATIVE Final   Staphylococcus aureus NEGATIVE NEGATIVE Final    Comment:        The Xpert SA Assay (FDA approved for NASAL specimens in patients over 23 years of age), is one component of a comprehensive surveillance program.  Test performance has been validated by [facility] for patients greater than or equal to 29 year old. It is not intended to diagnose infection nor to guide or monitor treatment.   MRSA PCR Screening     Status: None   Collection Time: 01/05/15  1:55 PM  Result Value Ref Range Status   MRSA by PCR NEGATIVE NEGATIVE Final    Comment:        The GeneXpert MRSA Assay (FDA approved for NASAL specimens only), is one component of a comprehensive MRSA colonization  surveillance program. It is not intended to diagnose MRSA infection nor to guide or monitor treatment for MRSA infections.     Coagulation Studies:  Recent Labs  01/07/15 0442  LABPROT 15.3*  INR 1.19    Urinalysis: No results for input(s): COLORURINE, LABSPEC, PHURINE, GLUCOSEU, HGBUR, BILIRUBINUR, KETONESUR, PROTEINUR, UROBILINOGEN, NITRITE, LEUKOCYTESUR in the last 72 hours.  Invalid input(s): APPERANCEUR    Imaging: No results found.   Medications:   . sodium chloride 75 mL/hr at 01/09/15 0400  . diltiazem (CARDIZEM) infusion 10 mg/hr (01/09/15 0400)   . clonazePAM  0.5 mg Oral BID  . diltiazem  120 mg Oral BID  . docusate sodium  100 mg Oral BID  . ferrous sulfate  325 mg Oral TID PC  . finasteride  5 mg Oral Daily  . galantamine  8 mg Oral BID WC  . heparin subcutaneous  5,000 Units Subcutaneous Q12H  . Influenza vac split  quadrivalent PF  0.5 mL Intramuscular Tomorrow-1000  . pantoprazole (PROTONIX) IV  40 mg Intravenous Q12H  . PARoxetine  10 mg Oral BH-q7a  . piperacillin-tazobactam (ZOSYN)  IV  3.375 g Intravenous Q12H  . sodium chloride  3 mL Intravenous Q12H  . tamsulosin  0.4 mg Oral Daily   acetaminophen, alum & mag hydroxide-simeth, bisacodyl, haloperidol lactate, HYDROcodone-acetaminophen, menthol-cetylpyridinium **OR** phenol, morphine injection, morphine injection, ondansetron **OR** ondansetron (ZOFRAN) IV, polyethylene glycol  Assessment/ Plan:  81 y.o. male with past medical history of anemia, atrial fibrillation, hypertension, hyperlipidemia, BPH, Aortic stenosis and coronary artery disease. Hospital course was complicated by aspiration pneumonia and acute kidney injury.  1. Acute renal failure. due ATN from hemodynamic instability and aspiration pneumonia 2. bacterial pneumonia/acute respiratory failure 3. Chronic kidney disease stage IV. Baseline creatinine 3.7/GFR 16:    Plan:  I had a long discussion with pt's daughter this AM, also discussed case with Dr. Sabra Heck.  Pt has worsening acute renal failure on top of advanced chronic kidney disease.  He would not make a good long term dialysis candidate.  We did talk about the potential for renal replacement therapy for the acute component.  This may potentially buy some time however isn't likely to change the ultimate outcome.  Discussed this in depth with the daughter and she stated that her father wouldn't have wanted to be on long term dialysis no matter the circumstance.  As such we agreed that we would not proceed with any form of dialysis at present.  The family continues to be interested in palliative/hospice care.  Dr. Megan Salon to see patient later today.  Family to be in tomorrow morning as well.  For now contiue supportive care as you are doing.        LOS: 5 Zev Blue 8/30/20169:29 AM

## 2015-01-09 NOTE — Progress Notes (Signed)
OT Cancellation Note  Patient Details Name: ELIGIO ANGERT MRN: [id number] DOB: 1931-03-07   Cancelled Treatment:    Reason Eval/Treat Not Completed: Patient's level of consciousness, had just been sedated.  Sharon Mt 01/09/2015, 5:08 PM

## 2015-01-09 NOTE — Progress Notes (Signed)
Physical Therapy Treatment Patient Details Name: Derek [name] MRN: [id number] DOB: 07-24-1930 Today's Date: 01/09/2015    History of Present Illness R hip fx with ORIF. Pt sent to ICU for rapid ventricular response and placed on cardizem drip for rate control. Pt still currently on cardizem drip (01/09/15)    PT Comments    Pt continues to be limited by cognition, however he did not display any combativeness with PT today. He was able to participate marginally in therex, but struggled executing desired movements, possibly secondary to pain (noted on his face occasionally). Pt vitals remained stable throughout session. Due to pt's ability to somewhat participate in therapy and less combativeness, he will continue to benefit from skilled PT in order to return him to premorbid state.   Follow Up Recommendations  SNF     Equipment Recommendations   (TBD)    Recommendations for Other Services       Precautions / Restrictions Precautions Precautions: Fall Restrictions Weight Bearing Restrictions: Yes RLE Weight Bearing: Partial weight bearing RLE Partial Weight Bearing Percentage or Pounds: 50    Mobility  Bed Mobility Overal bed mobility:  (Not assessed secondary to impaired cognition)                Transfers Overall transfer level:  (Not assessed secondary to impaired cognition)                  Ambulation/Gait Ambulation/Gait assistance:  (Not assessed secondary to impaired cognition)               Stairs            Wheelchair Mobility    Modified Rankin (Stroke Patients Only)       Balance Overall balance assessment: History of Falls                                  Cognition Arousal/Alertness: Lethargic Behavior During Therapy: Flat affect (mildly cooperative ) Overall Cognitive Status: Impaired/Different from baseline                      Exercises Other Exercises Other Exercises: Pt performed therex x 10  reps bilaterally at mod-max assist for facilitation of movement. Exercises performed: ankle pumps, heel slides, hip abd/add, and SAQ. Pt needs simple verbal cues, but will still require assistance. Pt continues to mumble, but appears to give some effort with therex. At end of session pt was able to verbalize a thank you and seemed to somewhat understand my purpose of being there.     General Comments General comments (skin integrity, edema, etc.): Pt's mobility attempts are limited today secondary to previous combativness and sensitive respiratory status.       Pertinent Vitals/Pain Pain Assessment:  (Not able to verbalize)    Home Living                      Prior Function            PT Goals (current goals can now be found in the care plan section) Acute Rehab PT Goals Patient Stated Goal: unable to verbalize appropriately Time For Goal Achievement: 01/21/15 Potential to Achieve Goals: Fair Progress towards PT goals: PT to reassess next treatment    Frequency  7X/week    PT Plan Frequency needs to be updated    Co-evaluation  End of Session   Activity Tolerance:  (Limited by cognition ) Patient left: in bed;with nursing/sitter in room     Time: 1051-1103 PT Time Calculation (min) (ACUTE ONLY): 12 min  Charges:                       G CodesJanyth Contes January 31, 2015, 1:05 PM  Janyth Contes, SPT. [phone]

## 2015-01-09 NOTE — Progress Notes (Signed)
Pharmacy Consult for Zosyn Indication: PNA  Allergies  Allergen Reactions  . Pradaxa [Dabigatran Etexilate Mesylate] Other (See Comments)    Patient Measurements: Height: 5\' 6"  (167.6 cm) Weight: 133 lb 2.5 oz (60.4 kg) IBW/kg (Calculated) : 63.8   Vital Signs: Temp: 98.5 F (36.9 C) (08/30 1200) Temp Source: Axillary (08/30 1200) BP: 154/69 mmHg (08/30 1500) Pulse Rate: 90 (08/30 1400) Intake/Output from previous day: 08/29 0701 - 08/30 0700 In: 897.9 [I.V.:872.9; IV Piggyback:25] Out: 1 [Urine:1] Intake/Output from this shift: Total I/O In: 610 [I.V.:560; IV Piggyback:50] Out: -   Labs:  Recent Labs  01/07/15 0442 01/08/15 0310 01/09/15 0457  WBC 14.0* 10.9* 8.1  HGB 8.3* 8.9* 8.0*  PLT 171 169 183  CREATININE 4.41* 4.33* 4.88*   Estimated Creatinine Clearance: 10.1 mL/min (by C-G formula based on Cr of 4.88). No results for input(s): VANCOTROUGH, VANCOPEAK, VANCORANDOM, GENTTROUGH, GENTPEAK, GENTRANDOM, TOBRATROUGH, TOBRAPEAK, TOBRARND, AMIKACINPEAK, AMIKACINTROU, AMIKACIN in the last 72 hours.   Microbiology: Recent Results (from the past 720 hour(s))  Surgical pcr screen     Status: None   Collection Time: 01/04/15 10:58 AM  Result Value Ref Range Status   MRSA, PCR NEGATIVE NEGATIVE Final   Staphylococcus aureus NEGATIVE NEGATIVE Final    Comment:        The Xpert SA Assay (FDA approved for NASAL specimens in patients over 49 years of age), is one component of a comprehensive surveillance program.  Test performance has been validated by [facility] for patients greater than or equal to 60 year old. It is not intended to diagnose infection nor to guide or monitor treatment.   MRSA PCR Screening     Status: None   Collection Time: 01/05/15  1:55 PM  Result Value Ref Range Status   MRSA by PCR NEGATIVE NEGATIVE Final    Comment:        The GeneXpert MRSA Assay (FDA approved for NASAL specimens only), is one component of a comprehensive MRSA  colonization surveillance program. It is not intended to diagnose MRSA infection nor to guide or monitor treatment for MRSA infections.     Medical History: Past Medical History  Diagnosis Date  . Undiagnosed cardiac murmurs   . CAD (coronary artery disease)   . Other and unspecified hyperlipidemia   . Unspecified essential hypertension   . Diaphragmatic hernia without mention of obstruction or gangrene   . Sensory hearing loss, unilateral   . Colon polyps   . Impaired fasting glucose   . Esophageal reflux   . Elevated prostate specific antigen (PSA)   . Unspecified disorder resulting from impaired renal function   . Unspecified disorder of kidney and ureter   . Alzheimer's dementia     Medications:  Scheduled:  . clonazePAM  0.5 mg Oral BID  . diltiazem  120 mg Oral BID  . docusate sodium  100 mg Oral BID  . ferrous sulfate  325 mg Oral TID PC  . finasteride  5 mg Oral Daily  . galantamine  8 mg Oral BID WC  . heparin subcutaneous  5,000 Units Subcutaneous Q12H  . Influenza vac split quadrivalent PF  0.5 mL Intramuscular Tomorrow-1000  . pantoprazole (PROTONIX) IV  40 mg Intravenous Q12H  . PARoxetine  10 mg Oral BH-q7a  . piperacillin-tazobactam (ZOSYN)  IV  3.375 g Intravenous Q12H  . sodium chloride  3 mL Intravenous Q12H  . tamsulosin  0.4 mg Oral Daily   Infusions:  . sodium chloride 75 mL/hr  at 01/09/15 1517  . diltiazem (CARDIZEM) infusion 5 mg/hr (01/09/15 1220)   PRN: acetaminophen, alum & mag hydroxide-simeth, bisacodyl, haloperidol lactate, HYDROcodone-acetaminophen, menthol-cetylpyridinium **OR** phenol, morphine injection, morphine injection, ondansetron **OR** ondansetron (ZOFRAN) IV, polyethylene glycol  Assessment: 81 y/o M s/p femur fracture repair who developed aflutter and later aspirated to begin empiric abx. Patient with CKD on day 5 of abx. Patient's therapy has been narrowed to Zosyn only.      Plan:  Will continue patient on Zosyn 3.375  g EI q 12 hours.   Pharmacy will continue to monitor and adjust per consult.   Simpson,Michael L 01/09/2015,3:33 PM

## 2015-01-09 NOTE — Progress Notes (Addendum)
Palliative Medicine Inpatient Consult Follow Up Note   Name: Derek [name] Date: 01/09/2015 MRN: [id number]  DOB: Oct 04, 1930  Referring Physician: [name], MD  Palliative Care consult requested for this 81 y.o. male for goals of medical therapy in patient with aspiration pneumonia, AFIB RVR, ACute on chronic renal failure, and severe metabolic encephalopathy (delerium on top of dementia) following fall with hip fx and right hip repair.     TODAY'S CONVERSATIONS, EVENTS, AND PLAN: I had a very long talk with one daughter today and will have a meeting with both daughters tomorrow.   The LIKELY plan will be for pt to go home (where he was living with one of his daughters) with Hospice care.   But, this is not definite. If his renal failure is very severe, he could end up going to [facility].   I learned that he has pretty bad BPH and noted he did not have a Foley in place. Because we need to know a bit more about the direction of his renal function so that palliative care needs / hospice needs will be appropriate, a Foley was ordered. Pt put out about 600 ml of clear urine. He has a nephrologist in [place] and he has had a 2D echo of his heart in [place]. Family would really like to know more about the status of his renal function and cardiac function before choosing a discharge place --as that is dependent on 'how long he has'.   So far, he has not been a good therapy candidate, but he could conceivably still end up being appropriate for a short term skilled (rehab) stay. This would have to be discussed with family as there are pros and cons to that scenario as well.    I will meet with both daughters at the end of the day on 01/11/15 --but hope to have more to tell them by that time.  He continues with DNR status and he is eating pureed foods with thin liquids.    REVIEW OF SYSTEMS:  Patient is not able to provide ROS due to dementia  CODE STATUS: DNR   PAST MEDICAL  HISTORY: Past Medical History  Diagnosis Date  . Undiagnosed cardiac murmurs   . CAD (coronary artery disease)   . Other and unspecified hyperlipidemia   . Unspecified essential hypertension   . Diaphragmatic hernia without mention of obstruction or gangrene   . Sensory hearing loss, unilateral   . Colon polyps   . Impaired fasting glucose   . Esophageal reflux   . Elevated prostate specific antigen (PSA)   . Unspecified disorder resulting from impaired renal function   . Unspecified disorder of kidney and ureter   . Alzheimer's dementia     PAST SURGICAL HISTORY:  Past Surgical History  Procedure Laterality Date  . Polyp removal  1991  . Hop r/o  1994    HTN past mild MI Dr. Gwenlyn Found  . Stress cardiolite  4/99    nml. Dr. Percival Spanish. no change when done 02/06/03  . Colonoscopy  10/99    no polyps. Dr. Vira Agar  . Esophagogastroduodenoscopy  08/15/02    hh erosions of stomah. Dr. Vira Agar  . Hernia repair  05/11/03  . Esophagogastroduodenoscopy  08/15/02    hh erosive gastropathy- duodenal mass   . Cystoscopy  5/07     thickening of bladder wall; other wise wnl  . Femur im nail Right 01/05/2015    Procedure: INTRAMEDULLARY (IM) NAIL FEMORAL;  Surgeon:  Thornton Park, MD;  Location: [facility] ORS;  Service: Orthopedics;  Laterality: Right;    Vital Signs: BP 126/64 mmHg  Pulse 77  Temp(Src) 98.4 F (36.9 C) (Axillary)  Resp 13  Ht 5\' 6"  (1.676 m)  Wt 60.4 kg (133 lb 2.5 oz)  BMI 21.50 kg/m2  SpO2 98% Filed Weights   01/07/15 0500 01/08/15 0547 01/09/15 0500  Weight: 60.7 kg (133 lb 13.1 oz) 58.3 kg (128 lb 8.5 oz) 60.4 kg (133 lb 2.5 oz)    Estimated body mass index is 21.5 kg/(m^2) as calculated from the following:   Height as of this encounter: 5\' 6"  (1.676 m).   Weight as of this encounter: 60.4 kg (133 lb 2.5 oz).  PHYSICAL EXAM: Confused  EOMI OP clear Neck supple and no JVD Heart rrr no mgr Lungs decreased BS bases Abd soft and nt Ext no  mottling   LABS: CBC:    Component Value Date/Time   WBC 8.1 01/09/2015 0457   WBC 6.1 07/27/2013 1048   HGB 8.0* 01/09/2015 0457   HGB 10.2* 07/27/2013 1048   HCT 24.5* 01/09/2015 0457   HCT 30.8* 07/27/2013 1048   PLT 183 01/09/2015 0457   PLT 145* 07/27/2013 1048   MCV 91.2 01/09/2015 0457   MCV 96 07/27/2013 1048   NEUTROABS 6.6* 01/09/2015 0457   NEUTROABS 4.7 07/27/2013 1048   LYMPHSABS 0.4* 01/09/2015 0457   LYMPHSABS 0.5* 07/27/2013 1048   MONOABS 0.8 01/09/2015 0457   MONOABS 0.8 07/27/2013 1048   EOSABS 0.2 01/09/2015 0457   EOSABS 0.0 07/27/2013 1048   BASOSABS 0.0 01/09/2015 0457   BASOSABS 0.0 07/27/2013 1048   BASOSABS 2 05/25/2012 1231   Comprehensive Metabolic Panel:    Component Value Date/Time   NA 142 01/09/2015 0457   NA 139 07/27/2013 1048   K 4.8 01/09/2015 0457   K 4.2 07/27/2013 1048   CL 113* 01/09/2015 0457   CL 106 07/27/2013 1048   CO2 18* 01/09/2015 0457   CO2 26 07/27/2013 1048   BUN 84* 01/09/2015 0457   BUN 51* 07/27/2013 1048   CREATININE 4.88* 01/09/2015 0457   CREATININE 2.98* 07/27/2013 1048   GLUCOSE 95 01/09/2015 0457   GLUCOSE 103* 07/27/2013 1048   CALCIUM 8.1* 01/09/2015 0457   CALCIUM 8.0* 07/27/2013 1048   AST 25 01/09/2015 0457   AST 18 07/27/2013 1048   ALT <5* 01/09/2015 0457   ALT 16 07/27/2013 1048   ALKPHOS 46 01/09/2015 0457   ALKPHOS 55 07/27/2013 1048   BILITOT 0.6 01/09/2015 0457   BILITOT 0.4 07/27/2013 1048   PROT 5.7* 01/09/2015 0457   PROT 6.3* 07/27/2013 1048   ALBUMIN 2.4* 01/09/2015 0457   ALBUMIN 2.9* 07/27/2013 1048    IMPRESSION: 1. Right hip fracture following fall due to poor safety awareness  ---s/p hip repair on 8/26 ---seems to be having some ongoing pain 2. Alzheimer's Dementia. ---He also has Primary Progressive Aphasia (an atypical presentation of Alzheimers in his case) 3. CAD ---He had a CABG  4. Chronic renal failure with creatinine about 3.5 or so prior to admission  (precise # unclear) 5. Acute renal failure --Foley ordered.  UA 'ok'  6.  BPH and retention ---FOLEY was ordered and pt put out 600 ml/ urine 7. AFib/ Flutter Rapid Vent response ---on Cardizem drip --changing to po 8. GERD on PPI 9. Undiagnosed heart murmurs? Daughter says he has had an echo in the past in [place]  --no echo here as yet  10. HTN 11.  Moderate protein calorie malnutrition 12.  Iron Deficiency Anemia (and h/o hematuria apparently) 13.  Possible Dysphagia (too early to determine)  See top of note for plan  More than 50% of the visit was spent in counseling/coordination of care: YES  Time Spent: 60 min

## 2015-01-09 NOTE — Progress Notes (Signed)
Palliative Care Update  See note from earlier today.  I had a very long talk with one daughter today and will have a meeting with both daughters tomorrow.   The LIKELY plan will be for pt to go home (where he was living with one of his daughters) with Hospice care.   But, this is not definite.  If his renal failure is very severe, he could end up going to [facility].    I learned that he has pretty bad BPH and noted he did not have a Foley in place.  Because we need to know a bit more about the direction of his renal function so that palliative care needs / hospice needs will be appropriate, a Foley was ordered. Pt put out about 600 ml of clear urine.  He has a nephrologist in [place] and he has had a 2D echo of his heart in [place].  Family would really like to know more about the status of his renal function and cardiac function before choosing a discharge place --as that is dependent on 'how long he has'.    So far, he has not been a good therapy candidate, but he could conceivably still end up being appropriate for a short term skilled (rehab) stay.  This would have to be discussed with family as there are pros and cons to that scenario as well.     I will meet with both daughters at the end of the day on 01/11/15 --but hope to have more to tell them by that time.  Colleen Can, MD

## 2015-01-09 NOTE — Evaluation (Signed)
Clinical/Bedside Swallow Evaluation Patient Details  Name: Derek [name] MRN: [id number] Date of Birth: 1931/02/25  Today's Date: 01/09/2015 Time: SLP Start Time (ACUTE ONLY): 1200 SLP Stop Time (ACUTE ONLY): 1300 SLP Time Calculation (min) (ACUTE ONLY): 60 min  Past Medical History:  Past Medical History  Diagnosis Date  . Undiagnosed cardiac murmurs   . CAD (coronary artery disease)   . Other and unspecified hyperlipidemia   . Unspecified essential hypertension   . Diaphragmatic hernia without mention of obstruction or gangrene   . Sensory hearing loss, unilateral   . Colon polyps   . Impaired fasting glucose   . Esophageal reflux   . Elevated prostate specific antigen (PSA)   . Unspecified disorder resulting from impaired renal function   . Unspecified disorder of kidney and ureter   . Alzheimer's dementia    Past Surgical History:  Past Surgical History  Procedure Laterality Date  . Polyp removal  1991  . Hop r/o  1994    HTN past mild MI Dr. Gwenlyn Found  . Stress cardiolite  4/99    nml. Dr. Percival Spanish. no change when done 02/06/03  . Colonoscopy  10/99    no polyps. Dr. Vira Agar  . Esophagogastroduodenoscopy  08/15/02    hh erosions of stomah. Dr. Vira Agar  . Hernia repair  05/11/03  . Esophagogastroduodenoscopy  08/15/02    hh erosive gastropathy- duodenal mass   . Cystoscopy  5/07     thickening of bladder wall; other wise wnl  . Femur im nail Right 01/05/2015    Procedure: INTRAMEDULLARY (IM) NAIL FEMORAL;  Surgeon: Thornton Park, MD;  Location: [facility] ORS;  Service: Orthopedics;  Laterality: Right;   HPI:  Pt is an 81 y/o male who has a h/o Alzheimer's Dementia, GERD, and diaphragmatic hernia as well as other multiple medical dxs. per chart. Pt is currently on a regular diet consistency w/ concern for aspiration since a fall resulting in a femur fx occured requiring hospitalization and surgery ~3 days ago. Pt is severely confused and poorly alert and awake at times.     Assessment / Plan / Recommendation Clinical Impression       Aspiration Risk  Mild    Diet Recommendation Dysphagia 1 (Puree);Thin   Medication Administration: Crushed with puree (as able) Compensations: Minimize environmental distractions;Slow rate;Small sips/bites;Check for pocketing    Other  Recommendations Recommended Consults:  (Dietician) Oral Care Recommendations: Oral care BID;Staff/trained caregiver to provide oral care   Follow Up Recommendations       Frequency and Duration min 3x week  1 week   Pertinent Vitals/Pain None indicated during the eval    SLP Swallow Goals  see care plan   Swallow Study Prior Functional Status       General Date of Onset: 01/04/15 Other Pertinent Information: Pt is an 81 y/o male who has a h/o Alzheimer's Dementia, GERD, and diaphragmatic hernia as well as other multiple medical dxs. per chart. Pt is currently on a regular diet consistency w/ concern for aspiration since a fall resulting in a femur fx occured requiring hospitalization and surgery ~3 days ago. Pt is severely confused and poorly alert and awake at times.  Type of Study: Bedside swallow evaluation Previous Swallow Assessment: none indicated Diet Prior to this Study: Regular;Thin liquids (per chart) Temperature Spikes Noted: No (wbc elevated on 8.28 and 8.29; wnl 8.30.16) Respiratory Status: Supplemental O2 delivered via (comment) (5-6 liters) History of Recent Intubation: No Behavior/Cognition: Confused;Impulsive;Uncooperative;Distractible;Requires cueing;Doesn't follow directions Oral Cavity -  Dentition: Missing dentition (has some lower front dentition) Self-Feeding Abilities: Total assist Patient Positioning: Upright in bed Baseline Vocal Quality: Low vocal intensity (mumbled speech) Volitional Cough: Cognitively unable to elicit Volitional Swallow: Unable to elicit    Oral/Motor/Sensory Function Overall Oral Motor/Sensory Function:  (unable to participate  for assessment of oral motor fx)   Ice Chips Ice chips:  (difficult to determine ) Presentation: Spoon Other Comments: 2 trials   Thin Liquid Thin Liquid: Impaired Presentation: Straw (fed) Oral Phase Impairments: Poor awareness of bolus (when drinking from straw) Oral Phase Functional Implications:  (none) Pharyngeal  Phase Impairments: Cough - Delayed (inconsistent) Other Comments: did not appear directly related to the bolus trials    Nectar Thick Nectar Thick Liquid: Not tested   Honey Thick Honey Thick Liquid: Not tested   Puree Puree: Impaired Presentation: Spoon (fed) Oral Phase Impairments: Poor awareness of bolus (upon being fed) Oral Phase Functional Implications:  (none) Pharyngeal Phase Impairments: Cough - Delayed (inconsistent) Other Comments: several trials of applesauce - ~3 ozs   Solid   GO    Solid: Not tested      Orinda Kenner, MS, CCC-SLP  Watson,Katherine 01/09/2015,4:04 PM

## 2015-01-09 NOTE — Progress Notes (Signed)
Patient resting well at this time. No display of pain. Slept during most of the shift. Agitated with care at times. Very drowsy this morning until mid afternoon. VSS. NSR per cardiac monitor on cardizem drip. Daughter visited and spoke with Palliative care doctor.

## 2015-01-09 NOTE — Progress Notes (Signed)
Patient ID: Elizer Bostic Laser, male   DOB: 01/07/1931, 81 y.o.   MRN: [id number] Patient ID: Jarel Cuadra Gambrell, male   DOB: Oct 23, 1930, 81 y.o.   MRN: [id number] Christapher Waldrop is a 81 y.o. male   SUBJECTIVE: Patient suffered  fracturing left proximal femur.  Had surgery 8/26 but decompensated witth hypoxia, A flutter , requiring NRBM Transferred to step down.  CXR negative initially but then RLL consolidation and WBC spiked confirming likely aspiration  Patient sleeping this morning with persistent agitation. ______________________________________________________________________  ROS: Review of systems is unremarkable for any active cardiac,respiratory, GI, GU, hematologic, neurologic or psychiatric systems, 10 systems reviewed.  . clonazePAM  0.5 mg Oral BID  . diltiazem  120 mg Oral BID  . docusate sodium  100 mg Oral BID  . ferrous sulfate  325 mg Oral TID PC  . finasteride  5 mg Oral Daily  . galantamine  8 mg Oral BID WC  . heparin subcutaneous  5,000 Units Subcutaneous Q12H  . Influenza vac split quadrivalent PF  0.5 mL Intramuscular Tomorrow-1000  . pantoprazole (PROTONIX) IV  40 mg Intravenous Q12H  . PARoxetine  10 mg Oral BH-q7a  . piperacillin-tazobactam (ZOSYN)  IV  3.375 g Intravenous Q12H  . sodium chloride  3 mL Intravenous Q12H  . tamsulosin  0.4 mg Oral Daily   acetaminophen, alum & mag hydroxide-simeth, bisacodyl, haloperidol lactate, HYDROcodone-acetaminophen, menthol-cetylpyridinium **OR** phenol, morphine injection, morphine injection, ondansetron **OR** ondansetron (ZOFRAN) IV, polyethylene glycol   Past Medical History  Diagnosis Date  . Undiagnosed cardiac murmurs   . CAD (coronary artery disease)   . Other and unspecified hyperlipidemia   . Unspecified essential hypertension   . Diaphragmatic hernia without mention of obstruction or gangrene   . Sensory hearing loss, unilateral   . Colon polyps   . Impaired fasting glucose   . Esophageal reflux   . Elevated prostate  specific antigen (PSA)   . Unspecified disorder resulting from impaired renal function   . Unspecified disorder of kidney and ureter   . Alzheimer's dementia     Past Surgical History  Procedure Laterality Date  . Polyp removal  1991  . Hop r/o  1994    HTN past mild MI Dr. Gwenlyn Found  . Stress cardiolite  4/99    nml. Dr. Percival Spanish. no change when done 02/06/03  . Colonoscopy  10/99    no polyps. Dr. Vira Agar  . Esophagogastroduodenoscopy  08/15/02    hh erosions of stomah. Dr. Vira Agar  . Hernia repair  05/11/03  . Esophagogastroduodenoscopy  08/15/02    hh erosive gastropathy- duodenal mass   . Cystoscopy  5/07     thickening of bladder wall; other wise wnl  . Femur im nail Right 01/05/2015    Procedure: INTRAMEDULLARY (IM) NAIL FEMORAL;  Surgeon: Thornton Park, MD;  Location: [facility] ORS;  Service: Orthopedics;  Laterality: Right;    PHYSICAL EXAM:  BP 114/65 mmHg  Pulse 68  Temp(Src) 98.6 F (37 C) (Oral)  Resp 17  Ht 5\' 6"  (1.676 m)  Wt 60.4 kg (133 lb 2.5 oz)  BMI 21.50 kg/m2  SpO2 100%  Wt Readings from Last 3 Encounters:  01/09/15 60.4 kg (133 lb 2.5 oz)  11/21/14 65.772 kg (145 lb)  11/03/14 58.968 kg (130 lb)           BP Readings from Last 3 Encounters:  01/09/15 114/65  11/22/14 185/99  11/04/14 166/67   Constitutional: ill appearing Neck: supple, no thyromegaly Respiratory:  bilateral rhonchi Cardiovascular: IRR, tachy Abdomen: soft, nml BS, nontender Extremities: no edema, moderate right anterior tenderness Neuro: Alert, agitated   Lab Results  Component Value Date   CREATININE 4.88* 01/09/2015   BUN 84* 01/09/2015   NA 142 01/09/2015   K 4.8 01/09/2015   CL 113* 01/09/2015   CO2 18* 01/09/2015   Lab Results  Component Value Date   WBC 8.1 01/09/2015   HGB 8.0* 01/09/2015   HCT 24.5* 01/09/2015   MCV 91.2 01/09/2015   PLT 183 01/09/2015     ASSESSMENT/PLAN: Resp failure- aspiration pneumonia, continue  Zosyn and off vancomycin, 6L Eva O2, off  NRBM   A flutter with RVR - on dilt drip- HR improved but not taking po -  Cardiology help appreciated, off Coumadin  Femoral fracture-orthopedic evaluation - s/p surgery  holding Coumadin but may restart once stable per cards and ortho Subcutaneous heparin  Progressive aphasia-associated dementia associated with delirium, galantamine and twice a day Klonopin, with agitation will try higher dose Haldol, avoid Ativan  CKD4-baseline creatinine 3.7 with worsening to 4.9 this morning, not a dialysis candidate, appreciate nephrology input   Overall very poor prognosis, hopefully can begin swallowing to take medications to allow weaning of IV diltiazem, palliative care consult Pt is DNR

## 2015-01-09 NOTE — Progress Notes (Signed)
[facility] Encounter Note  Patient: Derek [name] / Admit Date: 01/04/2015 / Date of Encounter: 01/09/2015, 8:08 AM   Subjective: Recurrence of tachycardia with rapid rate now more improved with continued use of diltiazem drip. Patient still very weak and still having significant concerns of hypoxia off and on. This morning the patient did convert back to normal sinus rhythm with appropriate medication management. Patient cannot take oral medications at this time Review of Systems:  Not able to assess Objective: Telemetry: Normal sinus rhythm Physical Exam: Blood pressure 114/65, pulse 68, temperature 98.6 F (37 C), temperature source Oral, resp. rate 17, height 5\' 6"  (1.676 m), weight 133 lb 2.5 oz (60.4 kg), SpO2 100 %. Body mass index is 21.5 kg/(m^2). General: Well developed, well nourished, in no acute distress. Head: Normocephalic, atraumatic, sclera non-icteric, no xanthomas, nares are without discharge. Neck: No apparent masses Lungs: Elevated respirations with moderate wheezes, no rhonchi, no rales , basilar crackles   Heart: Regular rate rate and rhythm, normal S1 S2, apical murmur, no rub, no gallop, PMI is normal size and placement, carotid upstroke normal without bruit, jugular venous pressure normal Abdomen: Soft, non-tender, non-distended with normoactive bowel sounds. No hepatosplenomegaly. Abdominal aorta is normal size without bruit Extremities: Trace to 1+ edema, no clubbing, no cyanosis, no ulcers,  Peripheral: 2+ radial, 2+ femoral, 2+ dorsal pedal pulses Neuro: Alert and oriented. Moves all extremities spontaneously.    Intake/Output Summary (Last 24 hours) at 01/09/15 0102 Last data filed at 01/09/15 0000  Gross per 24 hour  Intake 647.92 ml  Output      1 ml  Net 646.92 ml    Inpatient Medications:  . clonazePAM  0.5 mg Oral BID  . diltiazem  120 mg Oral BID  . docusate sodium  100 mg Oral BID  . ferrous sulfate  325 mg Oral TID PC   . finasteride  5 mg Oral Daily  . galantamine  8 mg Oral BID WC  . heparin subcutaneous  5,000 Units Subcutaneous Q12H  . Influenza vac split quadrivalent PF  0.5 mL Intramuscular Tomorrow-1000  . pantoprazole (PROTONIX) IV  40 mg Intravenous Q12H  . PARoxetine  10 mg Oral BH-q7a  . piperacillin-tazobactam (ZOSYN)  IV  3.375 g Intravenous Q12H  . sodium chloride  3 mL Intravenous Q12H  . tamsulosin  0.4 mg Oral Daily   Infusions:  . sodium chloride 75 mL/hr at 01/09/15 0400  . diltiazem (CARDIZEM) infusion 10 mg/hr (01/09/15 0400)    Labs:  Recent Labs  01/08/15 0310 01/09/15 0457  NA 141 142  K 4.8 4.8  CL 112* 113*  CO2 20* 18*  GLUCOSE 94 95  BUN 74* 84*  CREATININE 4.33* 4.88*  CALCIUM 8.2* 8.1*    Recent Labs  01/09/15 0457  AST 25  ALT <5*  ALKPHOS 46  BILITOT 0.6  PROT 5.7*  ALBUMIN 2.4*    Recent Labs  01/08/15 0310 01/09/15 0457  WBC 10.9* 8.1  NEUTROABS  --  6.6*  HGB 8.9* 8.0*  HCT 27.1* 24.5*  MCV 90.4 91.2  PLT 169 183   No results for input(s): CKTOTAL, CKMB, TROPONINI in the last 72 hours. Invalid input(s): POCBNP No results for input(s): HGBA1C in the last 72 hours.   Weights: Filed Weights   01/07/15 0500 01/08/15 0547 01/09/15 0500  Weight: 133 lb 13.1 oz (60.7 kg) 128 lb 8.5 oz (58.3 kg) 133 lb 2.5 oz (60.4 kg)     Radiology/Studies:  Dg Chest 1 View  01/05/2015   CLINICAL DATA:  Increasing shortness of Breath following femoral rod placement  EXAM: CHEST  1 VIEW  COMPARISON:  01/04/2015  FINDINGS: Cardiac shadow is within normal limits. Postoperative changes are again seen. Aortic calcifications are again noted. The lungs are well aerated bilaterally with some chronic interstitial change. No focal abnormality is seen.  IMPRESSION: No active disease.   Electronically Signed   By: Inez Catalina M.D.   On: 01/05/2015 11:45   Dg Chest 1 View  01/04/2015   CLINICAL DATA:  Right hip and left hip pain. Unwitnessed fall. Patient on  Coumadin. Preoperative study.  EXAM: CHEST  1 VIEW  COMPARISON:  11/03/2014  FINDINGS: Postoperative changes in the mediastinum. Heart size and pulmonary vascularity are normal. No focal airspace disease or consolidation. No pneumothorax. Calcified and tortuous aorta.  IMPRESSION: No active disease.   Electronically Signed   By: Lucienne Capers M.D.   On: 01/04/2015 03:12   Ct Head Wo Contrast  01/04/2015   CLINICAL DATA:  Fall with loss of consciousness. Slip on hardwood floor. On Coumadin with history of dementia.  EXAM: CT HEAD WITHOUT CONTRAST  TECHNIQUE: Contiguous axial images were obtained from the base of the skull through the vertex without intravenous contrast.  COMPARISON:  11/21/2014  FINDINGS: Unchanged cerebral atrophy. Unchanged chronic small vessel ischemic change.No intracranial hemorrhage, mass effect, or midline shift. No hydrocephalus. The basilar cisterns are patent. No evidence of territorial infarct. No intracranial fluid collection. Calvarium is intact. Included paranasal sinuses and mastoid air cells are well aerated.  IMPRESSION: Stable atrophy and chronic small vessel ischemic change without acute intracranial abnormality.   Electronically Signed   By: Jeb Levering M.D.   On: 01/04/2015 02:55   Dg Chest Port 1 View  01/06/2015   CLINICAL DATA:  81 year old male with acute respiratory failure. History of coronary artery disease.  EXAM: PORTABLE CHEST - 1 VIEW  COMPARISON:  Chest x-ray 01/05/2015.  FINDINGS: Lung volumes are slightly low. Airspace consolidation in the right lower lobe. Small right pleural effusion. Mild patchy interstitial prominence in the lungs bilaterally, similar to prior examinations, favored to reflect areas of scarring. Emphysematous changes. No evidence of pulmonary edema. Heart size is borderline enlarged upper mediastinal contours are within normal limits. Atherosclerosis in the thoracic aorta. Status post median sternotomy.  IMPRESSION: 1. Right lower  lobe consolidation concerning for pneumonia, with small right parapneumonic pleural effusion. 2. Chronic lung changes, including emphysema, redemonstrated, as above. 3. Atherosclerosis.   Electronically Signed   By: Vinnie Langton M.D.   On: 01/06/2015 13:55   Dg Hip Operative Unilat With Pelvis Right  01/05/2015   CLINICAL DATA:  Elective surgery.  EXAM: OPERATIVE right HIP (WITH PELVIS IF PERFORMED) 2 VIEWS  TECHNIQUE: Fluoroscopic spot image(s) were submitted for interpretation post-operatively.  COMPARISON:  Pelvis radiography 01/04/2015  FINDINGS: Intraoperative fluoroscopy shows placement of a femoral nail with dynamic femoral neck screw for treatment of a intertrochanteric and subtrochanteric right femur fracture. No evidence of intraoperative fracture or dislocation.  IMPRESSION: Fluoroscopy for proximal femur fracture fixation.   Electronically Signed   By: Monte Fantasia M.D.   On: 01/05/2015 09:30   Dg Hip Unilat With Pelvis 2-3 Views Right  01/04/2015   CLINICAL DATA:  Right-sided hip pain after slip and fall.  EXAM: DG HIP (WITH OR WITHOUT PELVIS) 2-3V RIGHT  COMPARISON:  None.  FINDINGS: Primarily oblique fracture of the right proximal femur with intertrochanteric and  subtrochanteric components. Minimal displacement. No significant angulation. Remainder the bony pelvis and left hip are intact. There are dense atherosclerotic calcifications of the pelvic vasculature. Tacks from bilateral hernia repairs noted.  IMPRESSION: Right proximal femur fracture with mild displacement involving the intertrochanteric and subtrochanteric femur.   Electronically Signed   By: Jeb Levering M.D.   On: 01/04/2015 03:13   Dg Femur Port, Min 2 Views Right  01/05/2015   CLINICAL DATA:  Status post medullary rod placement  EXAM: RIGHT FEMUR PORTABLE 1 VIEW  COMPARISON:  None.  FINDINGS: Medullary rod is noted. The proximal femoral fracture is reduced. A fixation screw is noted both proximally and distally.  Heavy vascular calcifications are noted. No acute bony abnormality or soft tissue abnormality is seen.  IMPRESSION: Status post ORIF of proximal right femoral fracture.   Electronically Signed   By: Inez Catalina M.D.   On: 01/05/2015 10:10     Assessment and Recommendation  81 y.o. male with acute respiratory failure likely secondary to aspiration and/or hypoxia with a syncopal episode and femur fracture and known coronary artery disease without evidence of current myocardial infarction with conversion to normal sinus rhythm with diltiazem drip  1. Continue diltiazem drip for heart rate control and blood pressure control despite conversion to normal sinus rhythm until patient is able to take oral medications 2. Anticoagulation if able if not causing anemia to reduce risk of stroke and deep venous thrombosis 3. Continue aggressive pulmonary treatment and care which appears to be slowly improving 4. Possible addition of oral medication management for heart rate control and/or use of amiodarone if patient continues to have significant high heart rate only if necessary  Signed, Serafina Royals M.D. FACC

## 2015-01-10 DIAGNOSIS — N170 Acute kidney failure with tubular necrosis: Secondary | ICD-10-CM

## 2015-01-10 LAB — COMPREHENSIVE METABOLIC PANEL
ALK PHOS: 41 U/L (ref 38–126)
ALT: <5 U/L — ABNORMAL LOW (ref 17–63)
ANION GAP: 6 (ref 5–15)
AST: 22 U/L (ref 15–41)
Albumin: 2.3 g/dL — ABNORMAL LOW (ref 3.5–5.0)
BUN: 86 mg/dL — ABNORMAL HIGH (ref 6–20)
CALCIUM: 7.8 mg/dL — AB (ref 8.9–10.3)
CO2: 19 mmol/L — AB (ref 22–32)
Chloride: 117 mmol/L — ABNORMAL HIGH (ref 101–111)
Creatinine, Ser: 5.22 mg/dL — ABNORMAL HIGH (ref 0.61–1.24)
GFR calc non Af Amer: 9 mL/min — ABNORMAL LOW (ref >60)
GFR, EST AFRICAN AMERICAN: 11 mL/min — AB (ref >60)
Glucose, Bld: 98 mg/dL (ref 65–99)
POTASSIUM: 4.3 mmol/L (ref 3.5–5.1)
SODIUM: 142 mmol/L (ref 135–145)
TOTAL PROTEIN: 5.1 g/dL — AB (ref 6.5–8.1)
Total Bilirubin: 0.7 mg/dL (ref 0.3–1.2)

## 2015-01-10 LAB — CBC WITH DIFFERENTIAL/PLATELET
BASOS PCT: 1 %
Basophils Absolute: 0 10*3/uL (ref 0–0.1)
EOS ABS: 0.2 10*3/uL (ref 0–0.7)
EOS PCT: 4 %
HCT: 23.1 % — ABNORMAL LOW (ref 40.0–52.0)
HEMOGLOBIN: 7.4 g/dL — AB (ref 13.0–18.0)
LYMPHS ABS: 0.4 10*3/uL — AB (ref 1.0–3.6)
Lymphocytes Relative: 7 %
MCH: 29.3 pg (ref 26.0–34.0)
MCHC: 32.2 g/dL (ref 32.0–36.0)
MCV: 90.9 fL (ref 80.0–100.0)
MONO ABS: 0.7 10*3/uL (ref 0.2–1.0)
MONOS PCT: 13 %
NEUTROS PCT: 75 %
Neutro Abs: 4.3 10*3/uL (ref 1.4–6.5)
Platelets: 166 10*3/uL (ref 150–440)
RBC: 2.54 MIL/uL — ABNORMAL LOW (ref 4.40–5.90)
RDW: 14.3 % (ref 11.5–14.5)
WBC: 5.7 10*3/uL (ref 3.8–10.6)

## 2015-01-10 MED ORDER — PROCHLORPERAZINE MALEATE 10 MG PO TABS: 10.0000 mg | ORAL_TABLET | Freq: Four times a day (QID) | ORAL | Status: AC | PRN

## 2015-01-10 MED ORDER — MORPHINE SULFATE (CONCENTRATE) 10 MG /0.5 ML PO SOLN: 5.0000 mg | ORAL | Status: AC | PRN

## 2015-01-10 MED ORDER — ACETAMINOPHEN 325 MG PO TABS: 650.0000 mg | ORAL_TABLET | Freq: Four times a day (QID) | ORAL | Status: AC | PRN

## 2015-01-10 MED ORDER — MORPHINE SULFATE (CONCENTRATE) 10 MG /0.5 ML PO SOLN: 10.0000 mg | ORAL | Status: AC | PRN

## 2015-01-10 MED ORDER — BISACODYL 10 MG RE SUPP: 10.0000 mg | RECTAL | Status: AC | PRN

## 2015-01-10 MED ORDER — SENNA 15 MG PO TABS: 1.0000 | ORAL_TABLET | Freq: Two times a day (BID) | ORAL | Status: DC | PRN

## 2015-01-10 MED ORDER — LORAZEPAM 0.5 MG PO TABS: 0.5000 mg | ORAL_TABLET | ORAL | Status: AC | PRN

## 2015-01-10 MED ORDER — SENNOSIDES-DOCUSATE SODIUM 8.6-50 MG PO TABS: 2.0000 | ORAL_TABLET | Freq: Every evening | ORAL | Status: AC | PRN

## 2015-01-10 NOTE — Progress Notes (Signed)
Dr Megan Salon in room with family requesting too have foley taken out.  MD gave order to d/c foley.

## 2015-01-10 NOTE — Progress Notes (Signed)
Patient is comfort care, to be discharge home with hospice via EMS.  Patient discharge instruction and prescriptions given to Daughter. Patient belonging packed and returned.  EMS notified, awaiting transport arrival.

## 2015-01-10 NOTE — Care Management Note (Signed)
Case Management Note  Patient Details  Name: Derek [name] MRN: [id number] Date of Birth: [birth year]  Subjective/Objective:   CM consult for hospice services.                  Action/Plan: Case Discussed with Dr. Megan Salon. Family has decided on comfort care only. Attempted to reach daughter and she has stepped away. Will provide list of hospice choices.   Expected Discharge Date:  01/08/15               Expected Discharge Plan:     In-House Referral:     Discharge planning Services  CM Consult  Post Acute Care Choice:  Hospice Choice offered to:  Adult Children  DME Arranged:    DME Agency:     HH Arranged:    HH Agency:     Status of Service:  In process, will continue to follow  Medicare Important Message Given:  Yes-second notification given Date Medicare IM Given:    Medicare IM give by:    Date Additional Medicare IM Given:    Additional Medicare Important Message give by:     If discussed at [place] of Stay Meetings, dates discussed:    Additional Comments:  Jolly Mango, RN 01/10/2015, 11:01 AM

## 2015-01-10 NOTE — Discharge Summary (Signed)
                                                                                    Derek [name], is a 81 y.o. male  DOB 06-06-1930  MRN [id number].  Admission date:  01/04/2015  Admitting Physician  Derek Aus, MD  Discharge Date:  01/10/2015    Admission Diagnosis  Sinus bradycardia [R00.1] Closed right hip fracture, initial encounter [S72.001A]  Discharge Diagnoses    Right hip fracture Aspiration pneumonia with acute resp failure Rapid a fib with RVR Acute renal failure with uremia Progressive dementia   Past Medical History  Diagnosis Date  . Undiagnosed cardiac murmurs   . CAD (coronary artery disease)   . Other and unspecified hyperlipidemia   . Unspecified essential hypertension   . Diaphragmatic hernia without mention of obstruction or gangrene   . Sensory hearing loss, unilateral   . Colon polyps   . Impaired fasting glucose   . Esophageal reflux   . Elevated prostate specific antigen (PSA)   . Unspecified disorder resulting from impaired renal function   . Unspecified disorder of kidney and ureter   . Alzheimer's dementia     Past Surgical History  Procedure Laterality Date  . Polyp removal  1991  . Hop r/o  1994    HTN past mild MI Dr. Gwenlyn [name]  . Stress cardiolite  4/99    nml. Dr. Percival [name]. no change when done 02/06/03  . Colonoscopy  10/99    no polyps. Dr. Vira [name]  . Esophagogastroduodenoscopy  08/15/02    hh erosions of stomah. Dr. Vira [name]  . Hernia repair  05/11/03  . Esophagogastroduodenoscopy  08/15/02    hh erosive gastropathy- duodenal mass   . Cystoscopy  5/07     thickening of bladder wall; other wise wnl  . Femur im nail Right 01/05/2015    Procedure: INTRAMEDULLARY (IM) NAIL FEMORAL;  Surgeon: Derek Park, MD;  Location: [facility] ORS;  Service: Orthopedics;  Laterality: Right;       History of present illness and  Hospital Course:     Kindly see H&P for history of present illness and admission details, please review complete Labs, Consult  reports and Test reports for all details in brief  HPI  from the history and physical done on the day of admission    Hospital Course   Pt was admitted and underwent right hip surgery. He had significant confusion and agitation postoperatively. He suffered rapid a fib and aspiration pneumonia with resp failure postop and was transferred to the ICU. His renal failure progressively worsened and family decided against dialysis and pt will go home with hospice.    Discharge Condition: terminal   Follow UP  None, hospice care    Discharge Instructions  and  Discharge Medications  Tylenol 650mg  q6hrs as needed Baseline hospice orders   Total Time in preparing paper work, data evaluation and todays exam - 35 minutes  [place] F. M.D on 01/10/2015 at 2:42 PM

## 2015-01-10 NOTE — Progress Notes (Signed)
New referral for [facility] services at home after discharge received from [facility] following a [place] consult with Dr. Megan Salon. Mr Sherrer was admitted on 8/25 for evaluation and treatment of a right hip fracture after a fall at home, where he lives with his daughter Sharee Pimple. He had an ORIF of a proximal right femoral fracture on 8/26. He was transferred to the ICU from PACU d/t episodes of atrial flutter and oxygen desaturations. He has continued to decline, requiring a cardizem drip and worsening kidney function. His PMH includes: Alzheimers dementia, CAD, reflux, diaphragmatic hernia and CKD. His daughters have met with Palliative Care physician Dr. Megan Salon and have chosen to take their father home with hospice services focused on comfort.  Writer met with patient's daughter Candie Chroman in his room. Education initiated regarding hospice services, team approach and philosophy of care with good understanding voiced. DME needs identified: Hospital bed, oxygen and suction machine ordered. All to be delivered as soon as possible. Sharee Pimple to be contact for delivery, address and phone numbers on face sheet verified as correct. Patient remained asleep thorough out visit, he had been premedicated for pain prior to writer's visit. He has had very poor po intake and has had difficulty taking po medications per Jill's report. Plan is for patient to discharge home today via EMS with portable DNR . DNR in place on patient's chart. CMRN Lattie Haw, Staff RN Tram,  Attending physician Dr. Ginette Pitman, Dr. Megan Salon and family all aware of and in agreement with plan. Thank you for the opportunity to participate in the care of this patient. Flo Shanks Rn, BSN, [place] [facility] [place], [facility] [phone] c

## 2015-01-10 NOTE — Progress Notes (Signed)
Subjective:  Pt remains critically ill and obtunded.   UOP actually picked up a bit in the past 24 hours at 1.2 liters.  However BUN/Cr both slightly worse.  Palliative care input noted from yesterday.   Objective:  Vital signs in last 24 hours:  Temp:  [98 F (36.7 C)-98.5 F (36.9 C)] 98.1 F (36.7 C) (08/31 0000) Pulse Rate:  [48-93] 81 (08/31 0800) Resp:  [11-21] 12 (08/31 0800) BP: (123-164)/(58-82) 144/68 mmHg (08/31 0800) SpO2:  [86 %-98 %] 95 % (08/31 0800) Weight:  [62 kg (136 lb 11 oz)] 62 kg (136 lb 11 oz) (08/31 0500)  Weight change: 1.6 kg (3 lb 8.4 oz) Filed Weights   01/08/15 0547 01/09/15 0500 01/10/15 0500  Weight: 58.3 kg (128 lb 8.5 oz) 60.4 kg (133 lb 2.5 oz) 62 kg (136 lb 11 oz)    Intake/Output:    Intake/Output Summary (Last 24 hours) at 01/10/15 0940 Last data filed at 01/10/15 0500  Gross per 24 hour  Intake   1660 ml  Output   1216 ml  Net    444 ml     Physical Exam: General:  critically ill-appearing, frail, elderly  HEENT  [place]/AT eyes closed  Neck  supple  Pulm/lungs  bilateral rhonchi, normal effort  CVS/Heart  S1S2 no rubs, regular this AM.  Abdomen:   soft, nontender, nondistended  Extremities:  no peripheral edema, right hip surgical bandage  Neurologic:  lethargic, not following commands  Skin:  no acute rashes          Basic Metabolic Panel:   Recent Labs Lab 01/06/15 0353 01/07/15 0442 01/08/15 0310 01/09/15 0457 01/10/15 0431  NA 140 143 141 142 142  K 5.6* 5.3* 4.8 4.8 4.3  CL 112* 115* 112* 113* 117*  CO2 21* 21* 20* 18* 19*  GLUCOSE 134* 120* 94 95 98  BUN 56* 69* 74* 84* 86*  CREATININE 3.84* 4.41* 4.33* 4.88* 5.22*  CALCIUM 8.4* 8.4* 8.2* 8.1* 7.8*     CBC:  Recent Labs Lab 01/04/15 0235 01/05/15 0646 01/06/15 0353 01/07/15 0442 01/08/15 0310 01/09/15 0457 01/10/15 0431  WBC 5.6 9.9 21.4* 14.0* 10.9* 8.1 5.7  NEUTROABS 4.1 8.4*  --   --   --  6.6* 4.3  HGB 8.1* 8.4* 9.8* 8.3* 8.9* 8.0* 7.4*   HCT 24.9* 25.7* 29.9* 25.5* 27.1* 24.5* 23.1*  MCV 92.7 93.3 89.3 89.6 90.4 91.2 90.9  PLT 204 191 202 171 169 183 166      Microbiology:  Recent Results (from the past 720 hour(s))  Surgical pcr screen     Status: None   Collection Time: 01/04/15 10:58 AM  Result Value Ref Range Status   MRSA, PCR NEGATIVE NEGATIVE Final   Staphylococcus aureus NEGATIVE NEGATIVE Final    Comment:        The Xpert SA Assay (FDA approved for NASAL specimens in patients over 7 years of age), is one component of a comprehensive surveillance program.  Test performance has been validated by [facility] for patients greater than or equal to 51 year old. It is not intended to diagnose infection nor to guide or monitor treatment.   MRSA PCR Screening     Status: None   Collection Time: 01/05/15  1:55 PM  Result Value Ref Range Status   MRSA by PCR NEGATIVE NEGATIVE Final    Comment:        The GeneXpert MRSA Assay (FDA approved for NASAL specimens only), is one  component of a comprehensive MRSA colonization surveillance program. It is not intended to diagnose MRSA infection nor to guide or monitor treatment for MRSA infections.     Coagulation Studies: No results for input(s): LABPROT, INR in the last 72 hours.  Urinalysis:  Recent Labs  01/09/15 1747  COLORURINE YELLOW*  LABSPEC 1.012  PHURINE 5.0  GLUCOSEU NEGATIVE  HGBUR 2+*  BILIRUBINUR NEGATIVE  KETONESUR NEGATIVE  PROTEINUR 100*  NITRITE NEGATIVE  LEUKOCYTESUR NEGATIVE      Imaging: No results found.   Medications:   . sodium chloride 75 mL/hr at 01/10/15 0430  . diltiazem (CARDIZEM) infusion 5 mg/hr (01/09/15 2000)   . diltiazem  120 mg Oral BID  . finasteride  5 mg Oral Daily  . galantamine  8 mg Oral BID WC  . Influenza vac split quadrivalent PF  0.5 mL Intramuscular Tomorrow-1000  . PARoxetine  10 mg Oral BH-q7a  . piperacillin-tazobactam (ZOSYN)  IV  3.375 g Intravenous Q12H  . sodium chloride  3  mL Intravenous Q12H  . tamsulosin  0.4 mg Oral Daily   acetaminophen, bisacodyl, haloperidol lactate, morphine injection, [DISCONTINUED] ondansetron **OR** ondansetron (ZOFRAN) IV, [DISCONTINUED] menthol-cetylpyridinium **OR** phenol, polyethylene glycol, senna-docusate  Assessment/ Plan:  81 y.o. male with past medical history of anemia, atrial fibrillation, hypertension, hyperlipidemia, BPH, Aortic stenosis and coronary artery disease. Hospital course was complicated by aspiration pneumonia and acute kidney injury.  1. Acute renal failure. due ATN from hemodynamic instability and aspiration pneumonia 2. bacterial pneumonia/acute respiratory failure 3. Chronic kidney disease stage IV. Baseline creatinine 3.7/GFR 16:    Plan:   BUN and Cr worse this AM, however urine output has actually improved. However overall the patient remains quite ill at this point in time. I had a long discussion with the patient's daughter yesterday. At baseline the patient has advanced renal dysfunction and the patient did not want renal replacement therapy for this.  Palliative care to meet with family later today.  Family did not want dialysis for the acute component of renal insufficiency.  This is reasonable given his comorbidities.  Will continue to follow.   LOS: 6 Galan Ghee 8/31/20169:40 AM

## 2015-01-10 NOTE — Progress Notes (Signed)
Subjective:  Patient is confused. He is unable to provide a history today.  Objective:   VITALS:   Filed Vitals:   01/10/15 0700 01/10/15 0800 01/10/15 0900 01/10/15 1000  BP: 129/82 144/68 142/74 142/70  Pulse: 84 81 93 82  Temp:  98.2 F (36.8 C)    TempSrc:  Oral    Resp: 18 12 16 18   Height:      Weight:      SpO2: 90% 95% 91% 99%    PHYSICAL EXAM:  Patient has spontaneous movements of both lower extremities. He has palpable pedal pulses. His dressings remained clean dry and intact. His incisions which can be seen through his honeycomb dressing, are also clean dry and intact. He has no thigh swelling or lower leg edema. Incision: dressing C/D/I  LABS  Results for orders placed or performed during the hospital encounter of 01/04/15 (from the past 24 hour(s))  Urinalysis complete, with microscopic ([facility] only)     Status: Abnormal   Collection Time: 01/09/15  5:47 PM  Result Value Ref Range   Color, Urine YELLOW (A) YELLOW   APPearance HAZY (A) CLEAR   Glucose, UA NEGATIVE NEGATIVE mg/dL   Bilirubin Urine NEGATIVE NEGATIVE   Ketones, ur NEGATIVE NEGATIVE mg/dL   Specific Gravity, Urine 1.012 1.005 - 1.030   Hgb urine dipstick 2+ (A) NEGATIVE   pH 5.0 5.0 - 8.0   Protein, ur 100 (A) NEGATIVE mg/dL   Nitrite NEGATIVE NEGATIVE   Leukocytes, UA NEGATIVE NEGATIVE   RBC / HPF 0-5 0 - 5 RBC/hpf   WBC, UA 0-5 0 - 5 WBC/hpf   Bacteria, UA RARE (A) NONE SEEN   Squamous Epithelial / LPF 0-5 (A) NONE SEEN   Mucous PRESENT    Amorphous Crystal PRESENT   CBC with Differential/Platelet     Status: Abnormal   Collection Time: 01/10/15  4:31 AM  Result Value Ref Range   WBC 5.7 3.8 - 10.6 K/uL   RBC 2.54 (L) 4.40 - 5.90 MIL/uL   Hemoglobin 7.4 (L) 13.0 - 18.0 g/dL   HCT 23.1 (L) 40.0 - 52.0 %   MCV 90.9 80.0 - 100.0 fL   MCH 29.3 26.0 - 34.0 pg   MCHC 32.2 32.0 - 36.0 g/dL   RDW 14.3 11.5 - 14.5 %   Platelets 166 150 - 440 K/uL   Neutrophils Relative % 75 %   Neutro Abs  4.3 1.4 - 6.5 K/uL   Lymphocytes Relative 7 %   Lymphs Abs 0.4 (L) 1.0 - 3.6 K/uL   Monocytes Relative 13 %   Monocytes Absolute 0.7 0.2 - 1.0 K/uL   Eosinophils Relative 4 %   Eosinophils Absolute 0.2 0 - 0.7 K/uL   Basophils Relative 1 %   Basophils Absolute 0.0 0 - 0.1 K/uL  Comprehensive metabolic panel     Status: Abnormal   Collection Time: 01/10/15  4:31 AM  Result Value Ref Range   Sodium 142 135 - 145 mmol/L   Potassium 4.3 3.5 - 5.1 mmol/L   Chloride 117 (H) 101 - 111 mmol/L   CO2 19 (L) 22 - 32 mmol/L   Glucose, Bld 98 65 - 99 mg/dL   BUN 86 (H) 6 - 20 mg/dL   Creatinine, Ser 5.22 (H) 0.61 - 1.24 mg/dL   Calcium 7.8 (L) 8.9 - 10.3 mg/dL   Total Protein 5.1 (L) 6.5 - 8.1 g/dL   Albumin 2.3 (L) 3.5 - 5.0 g/dL   AST 22  15 - 41 U/L   ALT <5 (L) 17 - 63 U/L   Alkaline Phosphatase 41 38 - 126 U/L   Total Bilirubin 0.7 0.3 - 1.2 mg/dL   GFR calc non Af Amer 9 (L) >60 mL/min   GFR calc Af Amer 11 (L) >60 mL/min   Anion gap 6 5 - 15    No results found.  Assessment/Plan: 5 Days Post-Op   Active Problems:   Femur fracture   SOB (shortness of breath)   Acute renal failure with tubular necrosis  Patient's health has continued to decline postoperatively. I spoke with the patient's daughter, Sherron Ales, today by phone. I'm aware of the plan to take him home with hospice care. I asked her to call me if I can be of any help or if she has any orthopedic questions or concerns once the patient arrives home.   Thornton Park , MD 01/10/2015, 5:33 PM

## 2015-01-10 NOTE — Progress Notes (Signed)
Palliative Medicine Inpatient Consult Follow Up Note   Name: Derek [name] Date: 01/10/2015 MRN: [id number]  DOB: 1931-03-17  Referring Physician: [name], MD  Palliative Care consult requested for this 81 y.o. male for goals of medical therapy in patient with Hip Fracture, AFib RVR, and acute renal failure --approaching end stage.  Pt would not want hemodialysis. He has a dementia with signs of advancing symptoms of dementia plus aphasia.  Family has been caring for him in home of daughter for many months now.  TODAY'S CONVERSATIONS, EVENTS, AND PLANS:  1.  Pt is DNR and Armandina Gemma Form is in hard chart  2.  Several hours of conversations have been spent with both daughters regarding the patient's condition and the options for care going forward given that they/ and pt would not want hemodialysis.  The daughters have ultimately agreed to have him return to daughter's home with [facility].  3.  He will have comfort only medications prescribed.    4.  I have updated Hospice Liaison, Care Mgmt, nursing, and all involved in managing his care going forward from this date.     REVIEW OF SYSTEMS:  Patient is not able to provide ROS due to critical illness  CODE STATUS: DNR   PAST MEDICAL HISTORY: Past Medical History  Diagnosis Date  . Undiagnosed cardiac murmurs   . CAD (coronary artery disease)   . Other and unspecified hyperlipidemia   . Unspecified essential hypertension   . Diaphragmatic hernia without mention of obstruction or gangrene   . Sensory hearing loss, unilateral   . Colon polyps   . Impaired fasting glucose   . Esophageal reflux   . Elevated prostate specific antigen (PSA)   . Unspecified disorder resulting from impaired renal function   . Unspecified disorder of kidney and ureter   . Alzheimer's dementia     PAST SURGICAL HISTORY:  Past Surgical History  Procedure Laterality Date  . Polyp removal  1991  . Hop r/o  1994    HTN past mild MI Dr. Gwenlyn Found   . Stress cardiolite  4/99    nml. Dr. Percival Spanish. no change when done 02/06/03  . Colonoscopy  10/99    no polyps. Dr. Vira Agar  . Esophagogastroduodenoscopy  08/15/02    hh erosions of stomah. Dr. Vira Agar  . Hernia repair  05/11/03  . Esophagogastroduodenoscopy  08/15/02    hh erosive gastropathy- duodenal mass   . Cystoscopy  5/07     thickening of bladder wall; other wise wnl  . Femur im nail Right 01/05/2015    Procedure: INTRAMEDULLARY (IM) NAIL FEMORAL;  Surgeon: Thornton Park, MD;  Location: [facility] ORS;  Service: Orthopedics;  Laterality: Right;    Vital Signs: BP 142/70 mmHg  Pulse 82  Temp(Src) 98.2 F (36.8 C) (Oral)  Resp 18  Ht 5\' 6"  (1.676 m)  Wt 62 kg (136 lb 11 oz)  BMI 22.07 kg/m2  SpO2 99% Filed Weights   01/08/15 0547 01/09/15 0500 01/10/15 0500  Weight: 58.3 kg (128 lb 8.5 oz) 60.4 kg (133 lb 2.5 oz) 62 kg (136 lb 11 oz)    Estimated body mass index is 22.07 kg/(m^2) as calculated from the following:   Height as of this encounter: 5\' 6"  (1.676 m).   Weight as of this encounter: 62 kg (136 lb 11 oz).  PHYSICAL EXAM: Moderate distress and anxiety and restlessness when he is awake --but better when sedated EOMI OP clear No JVD Heart  irreg irreg Lungs with some decrease in BS in bases  Abd soft and NT Ext dressing over hip repair site  LABS: CBC:    Component Value Date/Time   WBC 5.7 01/10/2015 0431   WBC 6.1 07/27/2013 1048   HGB 7.4* 01/10/2015 0431   HGB 10.2* 07/27/2013 1048   HCT 23.1* 01/10/2015 0431   HCT 30.8* 07/27/2013 1048   PLT 166 01/10/2015 0431   PLT 145* 07/27/2013 1048   MCV 90.9 01/10/2015 0431   MCV 96 07/27/2013 1048   NEUTROABS 4.3 01/10/2015 0431   NEUTROABS 4.7 07/27/2013 1048   LYMPHSABS 0.4* 01/10/2015 0431   LYMPHSABS 0.5* 07/27/2013 1048   MONOABS 0.7 01/10/2015 0431   MONOABS 0.8 07/27/2013 1048   EOSABS 0.2 01/10/2015 0431   EOSABS 0.0 07/27/2013 1048   BASOSABS 0.0 01/10/2015 0431   BASOSABS 0.0 07/27/2013 1048    BASOSABS 2 05/25/2012 1231   Comprehensive Metabolic Panel:    Component Value Date/Time   NA 142 01/10/2015 0431   NA 139 07/27/2013 1048   K 4.3 01/10/2015 0431   K 4.2 07/27/2013 1048   CL 117* 01/10/2015 0431   CL 106 07/27/2013 1048   CO2 19* 01/10/2015 0431   CO2 26 07/27/2013 1048   BUN 86* 01/10/2015 0431   BUN 51* 07/27/2013 1048   CREATININE 5.22* 01/10/2015 0431   CREATININE 2.98* 07/27/2013 1048   GLUCOSE 98 01/10/2015 0431   GLUCOSE 103* 07/27/2013 1048   CALCIUM 7.8* 01/10/2015 0431   CALCIUM 8.0* 07/27/2013 1048   AST 22 01/10/2015 0431   AST 18 07/27/2013 1048   ALT <5* 01/10/2015 0431   ALT 16 07/27/2013 1048   ALKPHOS 41 01/10/2015 0431   ALKPHOS 55 07/27/2013 1048   BILITOT 0.7 01/10/2015 0431   BILITOT 0.4 07/27/2013 1048   PROT 5.1* 01/10/2015 0431   PROT 6.3* 07/27/2013 1048   ALBUMIN 2.3* 01/10/2015 0431   ALBUMIN 2.9* 07/27/2013 1048    IMPRESSION:  1. Right hip fracture following fall due to poor safety awareness  ---s/p hip repair on 8/26 ---seems to be having some ongoing pain 2. Alzheimer's Dementia. ---He also has Primary Progressive Aphasia (an atypical presentation of Alzheimers in his case) 3. CAD ---He had a CABG  4. Chronic renal failure with creatinine about 3.5 or so prior to admission (precise # unclear) 5. Acute renal failure 6. AFib/ Flutter Rapid Vent response ---on Cardizem drip 7. BPH On Finasteride 8. GERD on PPI 9. Undiagnosed heart murmurs?  10. HTN 11.  Anemia --unclear etiology 12.  Severe protein calorie malnutrition 13.  Mild Thrombocytopenia  See top of note for plan  REFERRALS TO BE ORDERED:  [facility]osp[facility] for [facility]  More than 50% of the visit was spent in counseling/coordination of care: YES  Time Spent:  120 min

## 2015-01-10 NOTE — Progress Notes (Signed)
Dr Megan Salon in room after speaking with Daughter Sharee Pimple, MD gave order to not give anymore PO meds and to discontinue all gtt and meds, per MD, will put in order for comfort care and have patient go home with hospice.

## 2015-01-10 NOTE — Progress Notes (Signed)
PT Cancellation Note  Patient Details Name: [name] MRN: [id number] DOB: 08-06-1930   Cancelled Treatment:    Reason Eval/Treat Not Completed: Other (comment). Treatment attempted; spoke with family member and nursing and patient is to go home with hospice later today.    [name] 01/10/2015, 11:26 AM

## 2015-01-10 NOTE — Progress Notes (Signed)
Patient ID: Derek [name], male   DOB: 01-Feb-1931, 81 y.o.   MRN: [id number] Patient ID: Derek [name], male   DOB: 12/29/1930, 81 y.o.   MRN: [id number] Patient ID: Derek [name], male   DOB: 1931-01-13, 82 y.o.   MRN: [id number] Derek [name] is a 81 y.o. male   SUBJECTIVE: Patient suffered  fracturing left proximal femur.  Had surgery 8/26 but decompensated witth hypoxia, A flutter , requiring NRBM Transferred to step down.  CXR negative initially but then RLL consolidation and WBC spiked confirming likely aspiration  Patient essentially obtunded with minimal interaction. ______________________________________________________________________  ROS: Review of systems is unremarkable for any active cardiac,respiratory, GI, GU, hematologic, neurologic or psychiatric systems, 10 systems reviewed.  . diltiazem  120 mg Oral BID  . finasteride  5 mg Oral Daily  . galantamine  8 mg Oral BID WC  . Influenza vac split quadrivalent PF  0.5 mL Intramuscular Tomorrow-1000  . PARoxetine  10 mg Oral BH-q7a  . piperacillin-tazobactam (ZOSYN)  IV  3.375 g Intravenous Q12H  . sodium chloride  3 mL Intravenous Q12H  . tamsulosin  0.4 mg Oral Daily   acetaminophen, bisacodyl, haloperidol lactate, morphine injection, [DISCONTINUED] ondansetron **OR** ondansetron (ZOFRAN) IV, [DISCONTINUED] menthol-cetylpyridinium **OR** phenol, polyethylene glycol, senna-docusate   Past Medical History  Diagnosis Date  . Undiagnosed cardiac murmurs   . CAD (coronary artery disease)   . Other and unspecified hyperlipidemia   . Unspecified essential hypertension   . Diaphragmatic hernia without mention of obstruction or gangrene   . Sensory hearing loss, unilateral   . Colon polyps   . Impaired fasting glucose   . Esophageal reflux   . Elevated prostate specific antigen (PSA)   . Unspecified disorder resulting from impaired renal function   . Unspecified disorder of kidney and ureter   . Alzheimer's dementia     Past  Surgical History  Procedure Laterality Date  . Polyp removal  1991  . Hop r/o  1994    HTN past mild MI Dr. Gwenlyn Found  . Stress cardiolite  4/99    nml. Dr. Percival Spanish. no change when done 02/06/03  . Colonoscopy  10/99    no polyps. Dr. Vira Agar  . Esophagogastroduodenoscopy  08/15/02    hh erosions of stomah. Dr. Vira Agar  . Hernia repair  05/11/03  . Esophagogastroduodenoscopy  08/15/02    hh erosive gastropathy- duodenal mass   . Cystoscopy  5/07     thickening of bladder wall; other wise wnl  . Femur im nail Right 01/05/2015    Procedure: INTRAMEDULLARY (IM) NAIL FEMORAL;  Surgeon: Thornton Park, MD;  Location: [facility] ORS;  Service: Orthopedics;  Laterality: Right;    PHYSICAL EXAM:  BP 137/70 mmHg  Pulse 86  Temp(Src) 98.1 F (36.7 C) (Axillary)  Resp 14  Ht 5\' 6"  (1.676 m)  Wt 62 kg (136 lb 11 oz)  BMI 22.07 kg/m2  SpO2 94%  Wt Readings from Last 3 Encounters:  01/10/15 62 kg (136 lb 11 oz)  11/21/14 65.772 kg (145 lb)  11/03/14 58.968 kg (130 lb)           BP Readings from Last 3 Encounters:  01/10/15 137/70  11/22/14 185/99  11/04/14 166/67   Constitutional: ill appearing Neck: supple, no thyromegaly Respiratory: bilateral rhonchi Cardiovascular: RR,  Abdomen: soft, nml BS, nontender Extremities: no edema  Neuro: Somnolent   Lab Results  Component Value Date   CREATININE 5.22* 01/10/2015   BUN 86* 01/10/2015  NA 142 01/10/2015   K 4.3 01/10/2015   CL 117* 01/10/2015   CO2 19* 01/10/2015   Lab Results  Component Value Date   WBC 5.7 01/10/2015   HGB 7.4* 01/10/2015   HCT 23.1* 01/10/2015   MCV 90.9 01/10/2015   PLT 166 01/10/2015     ASSESSMENT/PLAN: Resp failure- aspiration pneumonia, continue  Zosyn and off vancomycin, 5L [place] O2, off NRBM   A flutter with RVR - on dilt drip-off Coumadin in normal sinus rhythm  Femoral fracture-orthopedic evaluation - s/p surgery, holding heparin with progressive anemia  Progressive aphasia-associated severe  dementia associated with delirium, off meds  Acute on chronic renal failure-baseline creatinine 3.7 with associated uremia, creatinine 5.2 with oliguria and proteinuria, agree with decision for no dialysis  Patient with progressive decline, entering full renal failure, appreciate palliative care, would lean toward hospice home

## 2015-01-10 NOTE — Care Management Note (Signed)
Case Management Note  Patient Details  Name: DOMNIQUE VANTINE MRN: [id number] Date of Birth: 06/01/1930  Subjective/Objective:   Spoke with daughter, Sharee Pimple. [facility]. She wants to take patient home today. Referral called to Santiago Glad with hospice..                 Action/Plan:   Expected Discharge Date:  01/08/15               Expected Discharge Plan:     In-House Referral:     Discharge planning Services  CM Consult  Post Acute Care Choice:  Hospice Choice offered to:  Adult Children  DME Arranged:    DME Agency:     HH Arranged:    HH Agency:     Status of Service:  In process, will continue to follow  Medicare Important Message Given:  Yes-second notification given Date Medicare IM Given:    Medicare IM give by:    Date Additional Medicare IM Given:    Additional Medicare Important Message give by:     If discussed at [place] of Stay Meetings, dates discussed:    Additional Comments:  Jolly Mango, RN 01/10/2015, 11:28 AM

## 2015-01-10 NOTE — Progress Notes (Signed)
RN clarify with Dr Megan Salon regarding patient's sitter order.  MD states "no he does not need a sitter anymore, we should continue to monitor him on cardiac monitor but you don't need to check his BP, I may  put in a PRN order maybe one before he leaves.  We'll leave the IV in until he goes home.   He can go home with the foley.  Call me if his HR is >120, I may order cardizem pushes."

## 2015-02-10 DEATH — deceased
